# Patient Record
Sex: Female | Born: 1985 | Race: White | Hispanic: Yes | Marital: Single | Smoking: Current some day smoker
Health system: Southern US, Community
[De-identification: ages and names within clinical notes are randomized; demographics above are authoritative.]

## PROBLEM LIST (undated history)

## (undated) DIAGNOSIS — F329 Major depressive disorder, single episode, unspecified: Secondary | ICD-10-CM

## (undated) DIAGNOSIS — F32A Depression, unspecified: Secondary | ICD-10-CM

## (undated) HISTORY — PX: APPENDECTOMY: SHX54

## (undated) HISTORY — PX: HERNIA REPAIR: SHX51

## (undated) HISTORY — PX: CHOLECYSTECTOMY: SHX55

---
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
## 2005-06-04 ENCOUNTER — Emergency Department (HOSPITAL_COMMUNITY): Admission: EM | Admit: 2005-06-04 | Discharge: 2005-06-04 | Payer: Self-pay | Admitting: Emergency Medicine

## 2005-06-29 ENCOUNTER — Emergency Department (HOSPITAL_COMMUNITY): Admission: EM | Admit: 2005-06-29 | Discharge: 2005-06-30 | Payer: Self-pay | Admitting: Emergency Medicine

## 2005-07-09 ENCOUNTER — Ambulatory Visit (HOSPITAL_COMMUNITY): Admission: RE | Admit: 2005-07-09 | Discharge: 2005-07-09 | Payer: Self-pay | Admitting: Gastroenterology

## 2005-07-30 ENCOUNTER — Ambulatory Visit (HOSPITAL_COMMUNITY): Admission: RE | Admit: 2005-07-30 | Discharge: 2005-07-31 | Payer: Self-pay | Admitting: General Surgery

## 2005-07-30 ENCOUNTER — Encounter (INDEPENDENT_AMBULATORY_CARE_PROVIDER_SITE_OTHER): Payer: Self-pay | Admitting: *Deleted

## 2006-12-21 ENCOUNTER — Emergency Department (HOSPITAL_COMMUNITY): Admission: EM | Admit: 2006-12-21 | Discharge: 2006-12-21 | Payer: Self-pay | Admitting: Emergency Medicine

## 2007-03-26 ENCOUNTER — Emergency Department (HOSPITAL_COMMUNITY): Admission: EM | Admit: 2007-03-26 | Discharge: 2007-03-26 | Payer: Self-pay | Admitting: Emergency Medicine

## 2007-09-12 ENCOUNTER — Emergency Department (HOSPITAL_COMMUNITY): Admission: EM | Admit: 2007-09-12 | Discharge: 2007-09-12 | Payer: Self-pay | Admitting: Emergency Medicine

## 2010-06-02 NOTE — Op Note (Signed)
Brooke Morgan             ACCOUNT NO.:  [id number]   MEDICAL RECORD NO.:  [id number]          PATIENT TYPE:  AMB   LOCATION:  ENDO                         FACILITY:  [facility]   PHYSICIAN:  Shirley Friar, MDDATE OF BIRTH:  1985-11-07   DATE OF PROCEDURE:  07/09/2005  DATE OF DISCHARGE:                                 OPERATIVE REPORT   REFERRING PHYSICIAN:  None.   INDICATION:  Abdominal pain, vomiting.   HISTORY:  A 25 year old Hispanic female who has had severe epigastric pain  radiating to her back since 2006 that is worse with eating.  She had  evidence of gallstones on ultrasound of October2006 but at the time could  not afford gallbladder surgery and waited until this point.  The abdominal  pain has now worsened and is unrelieved by Tums, Pepcid, Prilosec or other  acid-suppression medicines.  She is undergoing this upper endoscopy to rule  out peptic ulcer disease prior to referral to [facility] for  gallbladder removal.   MEDICATIONS:  Fentanyl 50 mcg IV, Versed 8 mg IV.   FINDINGS:  Endoscope was inserted into the esophagus which was normal in its  entirety.  It was advanced down into the stomach which revealed a small  hiatal hernia, but otherwise was normal without any ulcers or mucosal  abnormalities.  The endoscope was advanced down into the duodenal bulb into  the second portion of duodenum which were both normal.  Endoscope was then  withdrawn back into the stomach, and retroflexion again revealed a small  hiatal hernia but otherwise normal proximal stomach.  Endoscope was  withdrawn and confirmed above findings.   ASSESSMENT:  Hiatal hernia, otherwise negative esophagogastroduodenoscopy.   PLAN:  1.  Refer to [facility] for symptomatic gallstones.  2.  Low-fat, small portion meals.      Shirley Friar, MD  Electronically Signed     VCS/MEDQ  D:  07/09/2005  T:  07/09/2005  Job:  [id number]   cc:   Wilmon Arms.  Corliss Skains, M.D.  [address] [place] [address] [id number]  [place]

## 2010-06-02 NOTE — Op Note (Signed)
NAMECHYREL, TAHA             ACCOUNT NO.:  [id number]   MEDICAL RECORD NO.:  [id number]          PATIENT TYPE:  OIB   LOCATION:  5714                         FACILITY:  [facility]   PHYSICIAN:  Cherylynn Ridges, M.D.    DATE OF BIRTH:  Oct 19, 1985   DATE OF PROCEDURE:  07/30/2005  DATE OF DISCHARGE:                                 OPERATIVE REPORT   PREOP DIAGNOSIS:  Symptomatic cholelithiasis.   POSTOP DIAGNOSIS:  Symptomatic cholelithiasis.   PROCEDURE:  Laparoscopic cholecystectomy with cholangiogram.   SURGEON:  Cherylynn Ridges, M.D.   ASSISTANT:  Gabrielle Dare. Janee Morn, M.D. Dirk Dress as the Engineer, building services.   ANESTHESIA:  General endotracheal estimated blood loss less than 30 mL.   COMPLICATIONS:  None.   CONDITION:  Stable.   INDICATIONS FOR OPERATION:  The patient is a 25 year old with abdominal pain  in the right upper quadrant epigastrium with gallstones noted on ultrasound  who comes in now for a laparoscopic cholecystectomy.   FINDINGS:  The patient had adhesions to the gallbladder indicative of  previous inflammation and chronic cholecystitis.  No evidence of acute  cholecystitis.   OPERATION:  The patient was taken to the operating room, placed on table in  the supine position.  After an adequate endotracheal anesthetic was  administered she was prepped and draped in the usual sterile manner exposing  the midline in the right upper quadrant.   A supraumbilical curvilinear incision was made using a #11 blade and taken  down to the midline fascia.  It was at the level of the fascia that it was  grasped with Kocher clamps; was incised in between the Kocher clamps with a  #15 blade.  When this was down to the preperitoneal space which was then  bluntly pierced using a Kelly clamp into the peritoneal cavity.  A  pursestring suture of #0 Vicryl was passed around the fascial incision into  the peritoneal cavity; and then a Hassan cannula passed through  the  peritoneal opening into the peritoneal cavity and secured in place with a  pursestring suture.   Carbon dioxide gas was then instilled through the [facility] cannula into the  peritoneal cavity up to a maximum intra-abdominal pressure of 15 mmHg.  Then  two right costal margin 5-mm cannulas and a subxiphoid 11/12 mm cannula  passed under direct vision into the peritoneal cavity.  Once this was done,  the patient was placed in reverse Trendelenburg.  The Left-side was tilted  down and the dissection begun.   There were some adhesions to the gallbladder body of the omentum and part of  the duodenum which were taken down bluntly.  This exposed the infundibulum  as we retracted the gallbladder, towards the anterior abdominal wall and the  right upper quadrant.   We were able expose the peritoneum overlying the triangle of Calot and the  hepatic duodenal triangle; and then dissect out the cystic duct and the  cystic artery.  Along with that was the lymph node of Calot which was  dissected and taken along with the gallbladder side.  We made a window  around the cystic duct and then passed the endoclip along the gallbladder  side, made a cholecystodochotomy using the laparoscopic scissors; and then  performed a cholangiogram through the cholecystodochotomy using a Cook  catheter.  This showed good flow into the duodenum, no intraductal filling  defects, no dilated duct, and good proximal filling.  The [facility] catheter was  removed and endoclipped, which was then placed, securing the catheter in  place and was removed.  Then we endoclipped the cystic duct distally x2 and  transected the cystic duct.  We placed two proximal and two distal cystic  artery clips and then transected the cystic artery and then dissect out the  gallbladder from its bed with minimal difficulty.  We then brought out the  gallbladder from the supraumbilical site with minimal difficulty.  All  counts were correct.  No  EndoCatch bag was necessary.  We irrigated with  less than 1 liter of saline solution in the gallbladder fossa and right  upper quadrant; and then subsequently aspirated all gas and fluid from above  the liver and removed all cannulas.   The supraumbilical site was closed using a pursestring suture which was then  placed.  The skin was closed using a running subcuticular stitch of 4-0  Vicryl.  Then 1/4% Marcaine was injected in all sites prior to closing the  skin.  The sterile dressings were applied.  All needle count, sponge counts  and instrument counts were correct.      Cherylynn Ridges, M.D.  Electronically Signed     JOW/MEDQ  D:  07/30/2005  T:  07/30/2005  Job:  [id number]

## 2010-10-09 LAB — WET PREP, GENITAL: Trich, Wet Prep: NONE SEEN

## 2010-10-09 LAB — POCT PREGNANCY, URINE
Operator id: 146091
Preg Test, Ur: NEGATIVE

## 2010-10-09 LAB — URINALYSIS, ROUTINE W REFLEX MICROSCOPIC
Bilirubin Urine: NEGATIVE
Glucose, UA: NEGATIVE
Hgb urine dipstick: NEGATIVE
Ketones, ur: NEGATIVE
Nitrite: NEGATIVE
Protein, ur: NEGATIVE
Specific Gravity, Urine: 1.026
Urobilinogen, UA: 1
pH: 6

## 2010-10-09 LAB — GC/CHLAMYDIA PROBE AMP, GENITAL
Chlamydia, DNA Probe: NEGATIVE
GC Probe Amp, Genital: NEGATIVE

## 2011-07-12 ENCOUNTER — Encounter (HOSPITAL_BASED_OUTPATIENT_CLINIC_OR_DEPARTMENT_OTHER): Payer: Self-pay | Admitting: Emergency Medicine

## 2011-07-12 ENCOUNTER — Emergency Department (HOSPITAL_BASED_OUTPATIENT_CLINIC_OR_DEPARTMENT_OTHER)
Admission: EM | Admit: 2011-07-12 | Discharge: 2011-07-12 | Disposition: A | Discharge status: Home / Self Care | Payer: Self-pay | Attending: Emergency Medicine | Admitting: Emergency Medicine

## 2011-07-12 ENCOUNTER — Emergency Department (HOSPITAL_BASED_OUTPATIENT_CLINIC_OR_DEPARTMENT_OTHER): Payer: Self-pay

## 2011-07-12 DIAGNOSIS — R112 Nausea with vomiting, unspecified: Secondary | ICD-10-CM | POA: Insufficient documentation

## 2011-07-12 DIAGNOSIS — N72 Inflammatory disease of cervix uteri: Secondary | ICD-10-CM | POA: Insufficient documentation

## 2011-07-12 DIAGNOSIS — R109 Unspecified abdominal pain: Secondary | ICD-10-CM | POA: Insufficient documentation

## 2011-07-12 LAB — BASIC METABOLIC PANEL
BUN: 11 mg/dL (ref 6–23)
CO2: 24 mEq/L (ref 19–32)
Calcium: 9.4 mg/dL (ref 8.4–10.5)
Chloride: 101 mEq/L (ref 96–112)
Creatinine, Ser: 0.7 mg/dL (ref 0.50–1.10)
GFR calc Af Amer: >90 mL/min (ref >90)
GFR calc non Af Amer: >90 mL/min (ref >90)
Potassium: 3.8 mEq/L (ref 3.5–5.1)
Sodium: 137 mEq/L (ref 135–145)

## 2011-07-12 LAB — LIPASE, BLOOD: Lipase: 27 U/L (ref 11–59)

## 2011-07-12 LAB — WET PREP, GENITAL
Clue Cells Wet Prep HPF POC: NONE SEEN
Trich, Wet Prep: NONE SEEN
Yeast Wet Prep HPF POC: NONE SEEN

## 2011-07-12 LAB — CBC WITH DIFFERENTIAL/PLATELET
Basophils Absolute: 0 10*3/uL (ref 0.0–0.1)
Eosinophils Absolute: 0.2 10*3/uL (ref 0.0–0.7)
Eosinophils Relative: 1 % (ref 0–5)
HCT: 41 % (ref 36.0–46.0)
Hemoglobin: 14.4 g/dL (ref 12.0–15.0)
Lymphocytes Relative: 29 % (ref 12–46)
Lymphs Abs: 3.5 10*3/uL (ref 0.7–4.0)
MCH: 29.8 pg (ref 26.0–34.0)
MCHC: 35.1 g/dL (ref 30.0–36.0)
MCV: 84.7 fL (ref 78.0–100.0)
Monocytes Absolute: 1.4 10*3/uL — ABNORMAL HIGH (ref 0.1–1.0)
Monocytes Relative: 12 % (ref 3–12)
RBC: 4.84 MIL/uL (ref 3.87–5.11)
RDW: 13.6 % (ref 11.5–15.5)
WBC: 12.2 10*3/uL — ABNORMAL HIGH (ref 4.0–10.5)

## 2011-07-12 LAB — PREGNANCY, URINE: Preg Test, Ur: NEGATIVE

## 2011-07-12 LAB — URINALYSIS, ROUTINE W REFLEX MICROSCOPIC
Bilirubin Urine: NEGATIVE
Glucose, UA: NEGATIVE mg/dL
Hgb urine dipstick: NEGATIVE
Protein, ur: NEGATIVE mg/dL
Specific Gravity, Urine: 1.024 (ref 1.005–1.030)
Urobilinogen, UA: 0.2 mg/dL (ref 0.0–1.0)
pH: 6 (ref 5.0–8.0)

## 2011-07-12 LAB — URINE MICROSCOPIC-ADD ON

## 2011-07-12 MED ORDER — IOHEXOL 300 MG/ML  SOLN
20.0000 mL | Freq: Once | INTRAMUSCULAR | Status: AC | PRN
  Administered 2011-07-12: 20 mL via ORAL

## 2011-07-12 MED ORDER — CEFTRIAXONE SODIUM 250 MG IJ SOLR
250.0000 mg | Freq: Once | INTRAMUSCULAR | Status: AC
  Administered 2011-07-12: 250 mg via INTRAMUSCULAR
  Filled 2011-07-12: qty 250

## 2011-07-12 MED ORDER — LIDOCAINE HCL 2 % IJ SOLN
INTRAMUSCULAR | Status: AC
  Administered 2011-07-12: 1 mg
  Filled 2011-07-12: qty 1

## 2011-07-12 MED ORDER — HYDROCODONE-ACETAMINOPHEN 5-500 MG PO TABS: 1.0000 | ORAL_TABLET | Freq: Four times a day (QID) | ORAL | Status: AC | PRN

## 2011-07-12 MED ORDER — IOHEXOL 300 MG/ML  SOLN
100.0000 mL | Freq: Once | INTRAMUSCULAR | Status: AC | PRN
  Administered 2011-07-12: 100 mL via INTRAVENOUS

## 2011-07-12 MED ORDER — FENTANYL CITRATE 0.05 MG/ML IJ SOLN
50.0000 ug | Freq: Once | INTRAMUSCULAR | Status: AC
  Administered 2011-07-12: 50 ug via INTRAVENOUS
  Filled 2011-07-12: qty 2

## 2011-07-12 MED ORDER — AZITHROMYCIN 1 G PO PACK
1.0000 g | PACK | Freq: Once | ORAL | Status: AC
  Administered 2011-07-12: 1 g via ORAL
  Filled 2011-07-12: qty 1

## 2011-07-12 NOTE — ED Provider Notes (Signed)
History     CSN: [id number]  Arrival date & time 07/12/11  0110   First MD Initiated Contact with Patient 07/12/11 0134      Chief Complaint  Patient presents with  . Flank Pain    (Consider location/radiation/quality/duration/timing/severity/associated sxs/prior treatment) Patient is a 26 y.o. female presenting with abdominal pain. The history is provided by the patient. No language interpreter was used.  Abdominal Pain The primary symptoms of the illness include abdominal pain, nausea and vomiting. The primary symptoms of the illness do not include fever, fatigue, dysuria or vaginal discharge. The current episode started more than 2 days ago. The onset of the illness was gradual. The problem has been gradually worsening.  The abdominal pain began more than 2 days ago. The pain came on gradually. The abdominal pain has been gradually worsening since its onset. The abdominal pain is located in the LUQ and LLQ. The abdominal pain does not radiate. The severity of the abdominal pain is 8/10. The abdominal pain is relieved by nothing. Exacerbated by: nothing.  The vomiting began today. Vomiting occurred once. The emesis contains stomach contents.  Associated with: nothing. The patient states that she believes she is currently not pregnant. The patient has not had a change in bowel habit. Risk factors for an acute abdominal problem include a history of abdominal surgery. Symptoms associated with the illness do not include constipation or back pain. Significant associated medical issues do not include liver disease.    History reviewed. No pertinent past medical history.  Past Surgical History  Procedure Date  . Cholecystectomy   . Hernia repair     History reviewed. No pertinent family history.  History  Substance Use Topics  . Smoking status: Current Some Day Smoker  . Smokeless tobacco: Not on file  . Alcohol Use: Yes     ocassionally    OB History    Grav Para Term Preterm  Abortions TAB SAB Ect Mult Living                  Review of Systems  Constitutional: Negative for fever and fatigue.  Gastrointestinal: Positive for nausea, vomiting and abdominal pain. Negative for constipation.  Genitourinary: Negative for dysuria and vaginal discharge.  Musculoskeletal: Negative for back pain.  All other systems reviewed and are negative.    Allergies  Review of patient's allergies indicates no known allergies.  Home Medications  No current outpatient prescriptions on file.  BP 125/88  Pulse 84  Temp 98.1 F (36.7 C) (Oral)  Resp 20  Ht 5\' 2"  (1.575 m)  Wt 120 lb (54.432 kg)  BMI 21.95 kg/m2  SpO2 98%  LMP 06/16/2011  Physical Exam  Constitutional: She is oriented to person, place, and time. She appears well-developed and well-nourished. No distress.  HENT:  Head: Normocephalic and atraumatic.  Mouth/Throat: Oropharynx is clear and moist.  Eyes: Conjunctivae are normal. Pupils are equal, round, and reactive to light.  Neck: Normal range of motion. Neck supple.  Cardiovascular: Normal rate and regular rhythm.   Pulmonary/Chest: Effort normal and breath sounds normal.  Abdominal: Bowel sounds are normal. She exhibits no distension. There is tenderness. There is guarding. There is no rebound.  Genitourinary: Vaginal discharge found.       CMT, chaperone present  Musculoskeletal: Normal range of motion. She exhibits no tenderness.  Neurological: She is alert and oriented to person, place, and time.  Skin: Skin is warm and dry.  Psychiatric: She has a normal mood  and affect.    ED Course  Procedures (including critical care time)  Labs Reviewed  CBC WITH DIFFERENTIAL - Abnormal; Notable for the following:    WBC 12.2 (*)     Monocytes Absolute 1.4 (*)     All other components within normal limits  URINALYSIS, ROUTINE W REFLEX MICROSCOPIC - Abnormal; Notable for the following:    APPearance CLOUDY (*)     Leukocytes, UA TRACE (*)     All  other components within normal limits  WET PREP, GENITAL - Abnormal; Notable for the following:    WBC, Wet Prep HPF POC FEW (*)     All other components within normal limits  URINE MICROSCOPIC-ADD ON - Abnormal; Notable for the following:    Squamous Epithelial / LPF MANY (*)     Bacteria, UA MANY (*)     All other components within normal limits  LIPASE, BLOOD  PREGNANCY, URINE  BASIC METABOLIC PANEL  GC/CHLAMYDIA PROBE AMP, GENITAL   No results found.   No diagnosis found.    MDM  Will treat for STI.  Patient informed no sexual contact of any kind until 7 days after all partners treated.  Recheck in 7 days.  Return to the closest ED for worsening symptoms.  Patient informed of appendicolith, if future RLQ return to the closest ED.  Patient verbalizes understanding and agrees to follow up        Naryiah Schley Smitty Cords, MD 07/12/11 [phone]

## 2011-07-12 NOTE — ED Notes (Signed)
Left sided flank pain that radiates to left lower abdomen

## 2011-07-12 NOTE — Discharge Instructions (Signed)
Cervicitis   Cervicitis is a soreness and swelling (inflammation) of the cervix. Your cervix is located at the bottom of your uterus which opens up to the vagina.   CAUSES   Sexually transmitted infections (STIs).   Allergic reaction.   Medicines or birth control devices that are put in the vagina.   Injury to the cervix.   Bacterial infections.   SYMPTOMS   There may be no symptoms. If symptoms occur, they may include:   Grey, white, yellow, or bad smelling vaginal discharge.   Pain or itching of the area outside the vagina.   Painful sexual intercourse.   Lower abdominal or lower back pain, especially during intercourse.   Frequent urination.   Abnormal vaginal bleeding between periods, after sexual intercourse, or after menopause.   Pressure or a heavy feeling in the pelvis.   DIAGNOSIS   Diagnosis is made after a pelvic exam. Other tests may include:   Examination of any discharge under a microscope (wet prep).   A Pap test.   TREATMENT   Treatment will depend on the cause of cervicitis. If it is caused by an STI, both you and your partner will need to be treated. Antibiotic medicines will be given.   HOME CARE INSTRUCTIONS   Do not have sexual intercourse until your caregiver says it is okay.   Do not have sexual intercourse until your partner has been treated if your cervicitis is caused by an STI.   Take your antibiotics as directed. Finish them even if you start to feel better.   SEEK IMMEDIATE MEDICAL CARE IF:   Your symptoms come back.   You have a fever.   You experience any problems that may be related to the medicine you are taking.   MAKE SURE YOU:   Understand these instructions.   Will watch your condition.   Will get help right away if you are not doing well or get worse.   Document Released: 01/01/2005 Document Revised: 12/21/2010 Document Reviewed: 07/31/2010   [employer] Patient Information 2012 [employer].

## 2011-07-13 LAB — GC/CHLAMYDIA PROBE AMP, GENITAL
Chlamydia, DNA Probe: NEGATIVE
GC Probe Amp, Genital: NEGATIVE

## 2011-08-24 ENCOUNTER — Emergency Department (HOSPITAL_BASED_OUTPATIENT_CLINIC_OR_DEPARTMENT_OTHER)
Admission: EM | Admit: 2011-08-24 | Discharge: 2011-08-24 | Disposition: A | Discharge status: Home / Self Care | Payer: Self-pay | Attending: Emergency Medicine | Admitting: Emergency Medicine

## 2011-08-24 ENCOUNTER — Encounter (HOSPITAL_BASED_OUTPATIENT_CLINIC_OR_DEPARTMENT_OTHER): Payer: Self-pay | Admitting: *Deleted

## 2011-08-24 ENCOUNTER — Emergency Department (HOSPITAL_BASED_OUTPATIENT_CLINIC_OR_DEPARTMENT_OTHER): Payer: Self-pay

## 2011-08-24 DIAGNOSIS — Z87891 Personal history of nicotine dependence: Secondary | ICD-10-CM | POA: Insufficient documentation

## 2011-08-24 DIAGNOSIS — J40 Bronchitis, not specified as acute or chronic: Secondary | ICD-10-CM

## 2011-08-24 DIAGNOSIS — R05 Cough: Secondary | ICD-10-CM | POA: Insufficient documentation

## 2011-08-24 DIAGNOSIS — R059 Cough, unspecified: Secondary | ICD-10-CM | POA: Insufficient documentation

## 2011-08-24 DIAGNOSIS — J3489 Other specified disorders of nose and nasal sinuses: Secondary | ICD-10-CM | POA: Insufficient documentation

## 2011-08-24 MED ORDER — HYDROCOD POLST-CHLORPHEN POLST 10-8 MG/5ML PO LQCR: ORAL | Status: DC

## 2011-08-24 MED ORDER — AZITHROMYCIN 250 MG PO TABS: 250.0000 mg | ORAL_TABLET | Freq: Every day | ORAL | Status: AC

## 2011-08-24 MED ORDER — ALBUTEROL SULFATE HFA 108 (90 BASE) MCG/ACT IN AERS
2.0000 | INHALATION_SPRAY | RESPIRATORY_TRACT | Status: DC | PRN
  Filled 2011-08-24: qty 6.7

## 2011-08-24 NOTE — ED Provider Notes (Signed)
History     CSN: [id number]  Arrival date & time 08/24/11  1939   First MD Initiated Contact with Patient 08/24/11 2016      Chief Complaint  Patient presents with  . URI    (Consider location/radiation/quality/duration/timing/severity/associated sxs/prior treatment) HPI This is a 26 year old female with a three-week history of upper respiratory infection symptoms. This began with nasal congestion, cough, fever and malaise. Most of her symptoms, including fever have resolved but she has a lingering cough and shortness of breath; the shortness of breath is worse at night in bed. She's been taking over-the-counter cold medications without relief. The symptoms are moderate. She did have an episode of lightheadedness earlier today.  History reviewed. No pertinent past medical history.  Past Surgical History  Procedure Date  . Cholecystectomy   . Hernia repair     History reviewed. No pertinent family history.  History  Substance Use Topics  . Smoking status: Former Games developer  . Smokeless tobacco: Not on file  . Alcohol Use: Yes     ocassionally    OB History    Grav Para Term Preterm Abortions TAB SAB Ect Mult Living                  Review of Systems  All other systems reviewed and are negative.    Allergies  Review of patient's allergies indicates no known allergies.  Home Medications  No current outpatient prescriptions on file.  BP 117/67  Pulse 87  Temp 98.3 F (36.8 C) (Oral)  Resp 16  Ht 5' (1.524 m)  Wt 120 lb (54.432 kg)  BMI 23.44 kg/m2  SpO2 97%  LMP 08/23/2011  Physical Exam General: Well-developed, well-nourished female in no acute distress; appearance consistent with age of record HENT: normocephalic, atraumatic; no nasal congestion; no pharyngeal erythema or exudate Eyes: pupils equal round and reactive to light; extraocular muscles intact Neck: supple Heart: regular rate and rhythm Lungs: Faint wheezing right base; frequent dry  cough Abdomen: soft; nondistended; nontender; bowel sounds present Extremities: No deformity; full range of motion Neurologic: Awake, alert and oriented; motor function intact in all extremities and symmetric; no facial droop Skin: Warm and dry Psychiatric: Normal mood and affect    ED Course  Procedures (including critical care time)     MDM   Nursing notes and vitals signs, including pulse oximetry, reviewed.  Summary of this visit's results, reviewed by myself:   Imaging Studies: Dg Chest 2 View  08/24/2011  *RADIOLOGY REPORT*  Clinical Data: Cough  CHEST - 2 VIEW  Comparison: 07/30/2005  Findings: Lungs are essentially clear.  Calcified granulomata in the bilateral perihilar regions.  No pleural effusion or pneumothorax.  Cardiomediastinal silhouette is within normal limits.  Cholecystectomy clips.  Visualized osseous structures are within normal limits.  IMPRESSION: No evidence of acute cardiopulmonary disease.  Original Report Authenticated By: Charline Bills, M.D.            Hanley Seamen, MD 08/24/11 2025

## 2011-08-24 NOTE — Patient Instructions (Signed)
Instructed pt on the proper use of adminstering albuteral mdi via aerochamber pt tolerated well

## 2011-08-24 NOTE — ED Notes (Signed)
Pt c/o URI symptoms x 3 weeks 

## 2011-09-29 ENCOUNTER — Encounter (HOSPITAL_COMMUNITY): Payer: Self-pay

## 2011-09-29 ENCOUNTER — Emergency Department (HOSPITAL_COMMUNITY): Payer: Self-pay

## 2011-09-29 ENCOUNTER — Emergency Department (HOSPITAL_COMMUNITY)
Admission: EM | Admit: 2011-09-29 | Discharge: 2011-09-29 | Disposition: A | Discharge status: Home / Self Care | Payer: Self-pay | Attending: Emergency Medicine | Admitting: Emergency Medicine

## 2011-09-29 DIAGNOSIS — Z87891 Personal history of nicotine dependence: Secondary | ICD-10-CM | POA: Insufficient documentation

## 2011-09-29 DIAGNOSIS — K921 Melena: Secondary | ICD-10-CM | POA: Insufficient documentation

## 2011-09-29 DIAGNOSIS — N898 Other specified noninflammatory disorders of vagina: Secondary | ICD-10-CM | POA: Insufficient documentation

## 2011-09-29 DIAGNOSIS — Z9089 Acquired absence of other organs: Secondary | ICD-10-CM | POA: Insufficient documentation

## 2011-09-29 DIAGNOSIS — R109 Unspecified abdominal pain: Secondary | ICD-10-CM

## 2011-09-29 DIAGNOSIS — R1032 Left lower quadrant pain: Secondary | ICD-10-CM | POA: Insufficient documentation

## 2011-09-29 LAB — COMPREHENSIVE METABOLIC PANEL
AST: 22 U/L (ref 0–37)
Albumin: 4.2 g/dL (ref 3.5–5.2)
Alkaline Phosphatase: 65 U/L (ref 39–117)
Chloride: 103 mEq/L (ref 96–112)
Potassium: 4 mEq/L (ref 3.5–5.1)
Total Bilirubin: 0.5 mg/dL (ref 0.3–1.2)

## 2011-09-29 LAB — URINALYSIS, ROUTINE W REFLEX MICROSCOPIC
Glucose, UA: NEGATIVE mg/dL
Hgb urine dipstick: NEGATIVE
Ketones, ur: NEGATIVE mg/dL
Protein, ur: NEGATIVE mg/dL

## 2011-09-29 LAB — CBC WITH DIFFERENTIAL/PLATELET
Basophils Absolute: 0 10*3/uL (ref 0.0–0.1)
Basophils Relative: 0 % (ref 0–1)
MCHC: 34.6 g/dL (ref 30.0–36.0)
Neutro Abs: 4.9 10*3/uL (ref 1.7–7.7)
Neutrophils Relative %: 64 % (ref 43–77)
RDW: 13.4 % (ref 11.5–15.5)

## 2011-09-29 LAB — WET PREP, GENITAL: Yeast Wet Prep HPF POC: NONE SEEN

## 2011-09-29 LAB — POCT PREGNANCY, URINE: Preg Test, Ur: NEGATIVE

## 2011-09-29 MED ORDER — MORPHINE SULFATE 4 MG/ML IJ SOLN
4.0000 mg | Freq: Once | INTRAMUSCULAR | Status: AC
  Administered 2011-09-29: 4 mg via INTRAVENOUS
  Filled 2011-09-29: qty 1

## 2011-09-29 MED ORDER — IOHEXOL 300 MG/ML  SOLN
80.0000 mL | Freq: Once | INTRAMUSCULAR | Status: AC | PRN
  Administered 2011-09-29: 80 mL via INTRAVENOUS

## 2011-09-29 MED ORDER — ONDANSETRON HCL 4 MG/2ML IJ SOLN
4.0000 mg | Freq: Once | INTRAMUSCULAR | Status: AC
  Administered 2011-09-29: 4 mg via INTRAVENOUS
  Filled 2011-09-29: qty 2

## 2011-09-29 MED ORDER — POLYETHYLENE GLYCOL 3350 17 G PO PACK: 17.0000 g | PACK | Freq: Every day | ORAL | Status: AC

## 2011-09-29 MED ORDER — IOHEXOL 300 MG/ML  SOLN
20.0000 mL | INTRAMUSCULAR | Status: AC
  Administered 2011-09-29: 20 mL via ORAL

## 2011-09-29 NOTE — ED Provider Notes (Signed)
History     CSN: [id number]  Arrival date & time 09/29/11  1217   First MD Initiated Contact with Patient 09/29/11 1245      Chief Complaint  Patient presents with  . Abdominal Pain    (Consider location/radiation/quality/duration/timing/severity/associated sxs/prior treatment) Patient is a 26 y.o. female presenting with abdominal pain. The history is provided by the patient.  Abdominal Pain The primary symptoms of the illness include abdominal pain, hematochezia and vaginal discharge. The primary symptoms of the illness do not include fever, fatigue, shortness of breath, nausea, vomiting, diarrhea, hematemesis, dysuria or vaginal bleeding. The current episode started more than 2 days ago. The onset of the illness was sudden. The problem has been gradually worsening.  The pain came on suddenly. The abdominal pain is located in the LLQ, LUQ and left flank. The severity of the abdominal pain is 8/10.  The vaginal discharge is not associated with dysuria.  Symptoms associated with the illness do not include chills, urgency, hematuria or frequency.  Pt states pain comes and goes. Nothing makes it better or worse. States brown vaginal discharge. Has also seen blood when she wipes after a bowel movemetn. NO fever, chills, urinary symptoms, no n/v/d.  No past medical history on file.  Past Surgical History  Procedure Date  . Cholecystectomy   . Hernia repair     No family history on file.  History  Substance Use Topics  . Smoking status: Former Games developer  . Smokeless tobacco: Not on file  . Alcohol Use: Yes     ocassionally    OB History    Grav Para Term Preterm Abortions TAB SAB Ect Mult Living                  Review of Systems  Constitutional: Negative for fever, chills and fatigue.  HENT: Negative for neck pain and neck stiffness.   Respiratory: Negative for chest tightness and shortness of breath.   Cardiovascular: Negative for chest pain and leg swelling.    Gastrointestinal: Positive for abdominal pain, blood in stool and hematochezia. Negative for nausea, vomiting, diarrhea and hematemesis.  Genitourinary: Positive for flank pain and vaginal discharge. Negative for dysuria, urgency, frequency, hematuria and vaginal bleeding.  Musculoskeletal: Negative for myalgias.  Skin: Negative.   Neurological: Negative for dizziness, weakness and headaches.  Hematological: Does not bruise/bleed easily.    Allergies  Review of patient's allergies indicates no known allergies.  Home Medications  No current outpatient prescriptions on file.  BP 112/58  Pulse 65  Temp 97.7 F (36.5 C) (Oral)  Resp 16  SpO2 100%  Physical Exam  Nursing note and vitals reviewed. Constitutional: She is oriented to person, place, and time. She appears well-developed and well-nourished.  HENT:  Head: Normocephalic.  Eyes: Conjunctivae normal are normal.  Neck: Neck supple.  Cardiovascular: Normal rate, regular rhythm and normal heart sounds.   Pulmonary/Chest: Effort normal and breath sounds normal. No respiratory distress. She has no wheezes. She has no rales.  Abdominal: Soft. Bowel sounds are normal.       LUQ, LLQ tenderness. No CVA tenderness  Genitourinary: Vagina normal.       Normal vaginal canal, Brownish vaginal discharge. Normal cervix. No CMT. Left adnexal tenderness, uterus normal.   Musculoskeletal: Normal range of motion. She exhibits no edema.  Neurological: She is alert and oriented to person, place, and time.  Skin: Skin is warm and dry.  Psychiatric: She has a normal mood and affect.  ED Course  Procedures (including critical care time)  Pt with left sided abdominal pain. Hx of the same. Had negative CT 3 mon ago for similar pain.Will get labs, pain meds ordered, pelvic exam, urine  Results for orders placed during the hospital encounter of 09/29/11  CBC WITH DIFFERENTIAL      Component Value Range   WBC 7.7  4.0 - 10.5 K/uL   RBC 4.87   3.87 - 5.11 MIL/uL   Hemoglobin 14.4  12.0 - 15.0 g/dL   HCT 16.1  09.6 - 04.5 %   MCV 85.4  78.0 - 100.0 fL   MCH 29.6  26.0 - 34.0 pg   MCHC 34.6  30.0 - 36.0 g/dL   RDW 40.9  81.1 - 91.4 %   Platelets 304  150 - 400 K/uL   Neutrophils Relative 64  43 - 77 %   Neutro Abs 4.9  1.7 - 7.7 K/uL   Lymphocytes Relative 25  12 - 46 %   Lymphs Abs 1.9  0.7 - 4.0 K/uL   Monocytes Relative 9  3 - 12 %   Monocytes Absolute 0.7  0.1 - 1.0 K/uL   Eosinophils Relative 1  0 - 5 %   Eosinophils Absolute 0.1  0.0 - 0.7 K/uL   Basophils Relative 0  0 - 1 %   Basophils Absolute 0.0  0.0 - 0.1 K/uL  COMPREHENSIVE METABOLIC PANEL      Component Value Range   Sodium 139  135 - 145 mEq/L   Potassium 4.0  3.5 - 5.1 mEq/L   Chloride 103  96 - 112 mEq/L   CO2 28  19 - 32 mEq/L   Glucose, Bld 80  70 - 99 mg/dL   BUN 14  6 - 23 mg/dL   Creatinine, Ser 7.82  0.50 - 1.10 mg/dL   Calcium 9.9  8.4 - 95.6 mg/dL   Total Protein 7.7  6.0 - 8.3 g/dL   Albumin 4.2  3.5 - 5.2 g/dL   AST 22  0 - 37 U/L   ALT 12  0 - 35 U/L   Alkaline Phosphatase 65  39 - 117 U/L   Total Bilirubin 0.5  0.3 - 1.2 mg/dL   GFR calc non Af Amer >90  >90 mL/min   GFR calc Af Amer >90  >90 mL/min  URINALYSIS, ROUTINE W REFLEX MICROSCOPIC      Component Value Range   Color, Urine YELLOW  YELLOW   APPearance CLEAR  CLEAR   Specific Gravity, Urine 1.028  1.005 - 1.030   pH 6.0  5.0 - 8.0   Glucose, UA NEGATIVE  NEGATIVE mg/dL   Hgb urine dipstick NEGATIVE  NEGATIVE   Bilirubin Urine NEGATIVE  NEGATIVE   Ketones, ur NEGATIVE  NEGATIVE mg/dL   Protein, ur NEGATIVE  NEGATIVE mg/dL   Urobilinogen, UA 0.2  0.0 - 1.0 mg/dL   Nitrite NEGATIVE  NEGATIVE   Leukocytes, UA NEGATIVE  NEGATIVE  POCT PREGNANCY, URINE      Component Value Range   Preg Test, Ur NEGATIVE  NEGATIVE  LIPASE, BLOOD      Component Value Range   Lipase 23  11 - 59 U/L  WET PREP, GENITAL      Component Value Range   Yeast Wet Prep HPF POC NONE SEEN  NONE SEEN    Trich, Wet Prep NONE SEEN  NONE SEEN   Clue Cells Wet Prep HPF POC FEW (*) NONE SEEN  WBC, Wet Prep HPF POC MODERATE (*) NONE SEEN   US Transvaginal Non-ob  09/29/2011  *RADIOLOGY REPORT*  Clinical Data:  Pelvic pain, query ovarian torsion or tubo-ovarian abscess.  TRANSABDOMINAL AND TRANSVAGINAL ULTRASOUND OF PELVIS DOPPLER ULTRASOUND OF OVARIES  Technique:  Both transabdominal and transvaginal ultrasound examinations of the pelvis were performed. Transabdominal technique was performed for global imaging of the pelvis including uterus, ovaries, adnexal regions, and pelvic cul-de-sac.  It was necessary to proceed with endovaginal exam following the transabdominal exam to visualize the uterus, endometrium, and ovaries.  Color and duplex Doppler ultrasound was utilized to evaluate blood flow to the ovaries.  Comparison:  07/12/2011  Findings:  Uterus:  On image 43, a 7 mm punctate hyper echogenicity in the anterior myometrium may reflect an incidental punctate calcification.  No overt shadowing.  No myometrial mass observed.  Endometrium:  Measures 1 mm in thickness, within normal limits.  Right ovary: Measures 2.3 x 1.5 x 1.9 cm and contains multiple follicles.  No mass lesion.  Left ovary:    Measures 2.1 x 1.3 x 1.6 cm and contains multiple follicles.  No mass lesion.  Pulsed Doppler evaluation demonstrates normal low-resistance arterial and venous waveforms in both ovaries.  Other:  No free pelvic fluid observed.  IMPRESSION: Normal exam.  No evidence of pelvic mass, tubo-ovarian abscess, or other significant abnormality.  No sonographic evidence for ovarian torsion.   Original Report Authenticated By: Dellia Cloud, M.D.    US Pelvis Complete  09/29/2011  *RADIOLOGY REPORT*  Clinical Data:  Pelvic pain, query ovarian torsion or tubo-ovarian abscess.  TRANSABDOMINAL AND TRANSVAGINAL ULTRASOUND OF PELVIS DOPPLER ULTRASOUND OF OVARIES  Technique:  Both transabdominal and transvaginal ultrasound  examinations of the pelvis were performed. Transabdominal technique was performed for global imaging of the pelvis including uterus, ovaries, adnexal regions, and pelvic cul-de-sac.  It was necessary to proceed with endovaginal exam following the transabdominal exam to visualize the uterus, endometrium, and ovaries.  Color and duplex Doppler ultrasound was utilized to evaluate blood flow to the ovaries.  Comparison:  07/12/2011  Findings:  Uterus:  On image 43, a 7 mm punctate hyper echogenicity in the anterior myometrium may reflect an incidental punctate calcification.  No overt shadowing.  No myometrial mass observed.  Endometrium:  Measures 1 mm in thickness, within normal limits.  Right ovary: Measures 2.3 x 1.5 x 1.9 cm and contains multiple follicles.  No mass lesion.  Left ovary:    Measures 2.1 x 1.3 x 1.6 cm and contains multiple follicles.  No mass lesion.  Pulsed Doppler evaluation demonstrates normal low-resistance arterial and venous waveforms in both ovaries.  Other:  No free pelvic fluid observed.  IMPRESSION: Normal exam.  No evidence of pelvic mass, tubo-ovarian abscess, or other significant abnormality.  No sonographic evidence for ovarian torsion.   Original Report Authenticated By: Dellia Cloud, M.D.    [place] Art/ven Flow Abd Pelv Doppler  09/29/2011  *RADIOLOGY REPORT*  Clinical Data:  Pelvic pain, query ovarian torsion or tubo-ovarian abscess.  TRANSABDOMINAL AND TRANSVAGINAL ULTRASOUND OF PELVIS DOPPLER ULTRASOUND OF OVARIES  Technique:  Both transabdominal and transvaginal ultrasound examinations of the pelvis were performed. Transabdominal technique was performed for global imaging of the pelvis including uterus, ovaries, adnexal regions, and pelvic cul-de-sac.  It was necessary to proceed with endovaginal exam following the transabdominal exam to visualize the uterus, endometrium, and ovaries.  Color and duplex Doppler ultrasound was utilized to evaluate blood flow to the ovaries.  Comparison:  07/12/2011  Findings:  Uterus:  On image 43, a 7 mm punctate hyper echogenicity in the anterior myometrium may reflect an incidental punctate calcification.  No overt shadowing.  No myometrial mass observed.  Endometrium:  Measures 1 mm in thickness, within normal limits.  Right ovary: Measures 2.3 x 1.5 x 1.9 cm and contains multiple follicles.  No mass lesion.  Left ovary:    Measures 2.1 x 1.3 x 1.6 cm and contains multiple follicles.  No mass lesion.  Pulsed Doppler evaluation demonstrates normal low-resistance arterial and venous waveforms in both ovaries.  Other:  No free pelvic fluid observed.  IMPRESSION: Normal exam.  No evidence of pelvic mass, tubo-ovarian abscess, or other significant abnormality.  No sonographic evidence for ovarian torsion.   Original Report Authenticated By: Dellia Cloud, M.D.     4:24 PM [place] normal. Pt's blood work is normal. Will hold off on another CT, doubt infectious process, no elevated WBC, normal electrolyte, will d/c home with follow up for further evaluation. Did not treat for possible cervicitis bc pt refused tx, states not sexually active.   Pt reassessed. She continues to have a lot of pain. She is tearful. She is now having some guarding on the abdominal exam in the left lower quadrant. Will get CT to r/o colitis/surgical pathology. Pt signed out with PA oliveri at shift change  1. Abdominal pain       MDM          Lottie Mussel, PA 09/30/11 1116

## 2011-09-29 NOTE — ED Notes (Signed)
Patient to ED with C/O abdominal pain that began 3 days ago. Pain is across her lower abdomen and is in her LUQ.  Pain is sharp in her LUQ and dull across her lower abdomen.  LUQ is very tender to palpation. C/O some blood in stool when she wipes and C/O brown vaginal discharge.  Denies Nausea , vomiting, diarrhea and loss of appetite.

## 2011-09-29 NOTE — ED Notes (Signed)
Pt complains of abd pain in low abd and left flank area, sts getting wosre with time

## 2011-09-29 NOTE — ED Provider Notes (Signed)
Patient care assumed from T.K. Patient given additional pain medication in ED with improvement. Imaging negative for acute abdomen but remarkable for large amount of fecal matter. Patient discharged on bowel regimen, and referred to OB/GYN as she has never had a pap smear and has had some brownish discharge that she cannot attribute the cause. No red flags for PID or appendicitis. Return precautions given verbally and in discharge summary.   CT Abdomen Pelvis W Contrast (Final result)   Result time:09/29/11 2010    Final result by Rad Results In Interface (09/29/11 20:10:09)    Narrative:   *RADIOLOGY REPORT*  Clinical Data: Days of lower abdominal pain, most severe in the left lower quadrant. The  CT ABDOMEN AND PELVIS WITH CONTRAST  Technique: Multidetector CT imaging of the abdomen and pelvis was performed following the standard protocol during bolus administration of intravenous contrast.  Contrast: 80mL OMNIPAQUE IOHEXOL 300 MG/ML SOLN  Comparison: 07/12/2011. Ultrasound today.  Findings: Lung bases are clear. No pleural or pericardial fluid. The liver has a normal appearance. There is been previous cholecystectomy. The spleen is normal. The pancreas is normal. The adrenal glands are normal. The kidneys are normal. No cyst, mass, stone or hydronephrosis. The aorta and IVC are normal. No retroperitoneal mass or adenopathy. No free intraperitoneal fluid or air. The appendix does not show any signs of inflammation. There is a small appendicolith. The uterus and adnexal regions appear unremarkable. There is a large amount of fecal matter in the colon that could possibly be symptomatic. No sign of bowel inflammation.  IMPRESSION: No acute finding other than a large amount of fecal matter in the colon. Previous cholecystectomy. As noted previously, there is a small appendicolith but no sign of appendiceal inflammation.   Original Report Authenticated By: Thomasenia Sales, M.D.            Pixie Casino, PA-C 09/29/11 2050

## 2011-09-30 NOTE — ED Provider Notes (Signed)
Medical screening examination/treatment/procedure(s) were performed by non-physician practitioner and as supervising physician I was immediately available for consultation/collaboration.   Carleene Cooper III, MD 09/30/11 1640

## 2011-09-30 NOTE — ED Provider Notes (Signed)
.  Medical screening examination/treatment/procedure(s) were performed by non-physician practitioner and as supervising physician I was immediately available for consultation/collaboration.   Carleene Cooper III, MD 09/30/11 [phone]

## 2011-10-01 LAB — GC/CHLAMYDIA PROBE AMP, GENITAL: Chlamydia, DNA Probe: NEGATIVE

## 2011-10-06 ENCOUNTER — Emergency Department (HOSPITAL_COMMUNITY)
Admission: EM | Admit: 2011-10-06 | Discharge: 2011-10-07 | Disposition: A | Discharge status: Home / Self Care | Payer: Self-pay | Attending: Emergency Medicine | Admitting: Emergency Medicine

## 2011-10-06 ENCOUNTER — Encounter (HOSPITAL_COMMUNITY): Payer: Self-pay | Admitting: *Deleted

## 2011-10-06 DIAGNOSIS — R109 Unspecified abdominal pain: Secondary | ICD-10-CM | POA: Insufficient documentation

## 2011-10-06 DIAGNOSIS — R5383 Other fatigue: Secondary | ICD-10-CM | POA: Insufficient documentation

## 2011-10-06 DIAGNOSIS — Z87891 Personal history of nicotine dependence: Secondary | ICD-10-CM | POA: Insufficient documentation

## 2011-10-06 DIAGNOSIS — R531 Weakness: Secondary | ICD-10-CM

## 2011-10-06 DIAGNOSIS — F411 Generalized anxiety disorder: Secondary | ICD-10-CM | POA: Insufficient documentation

## 2011-10-06 DIAGNOSIS — F419 Anxiety disorder, unspecified: Secondary | ICD-10-CM

## 2011-10-06 DIAGNOSIS — R55 Syncope and collapse: Secondary | ICD-10-CM | POA: Insufficient documentation

## 2011-10-06 DIAGNOSIS — R5381 Other malaise: Secondary | ICD-10-CM | POA: Insufficient documentation

## 2011-10-06 LAB — CBC WITH DIFFERENTIAL/PLATELET
Eosinophils Absolute: 0.1 10*3/uL (ref 0.0–0.7)
Eosinophils Relative: 1 % (ref 0–5)
HCT: 40.4 % (ref 36.0–46.0)
Hemoglobin: 13.8 g/dL (ref 12.0–15.0)
Lymphocytes Relative: 35 % (ref 12–46)
Lymphs Abs: 3.2 10*3/uL (ref 0.7–4.0)
MCH: 29.1 pg (ref 26.0–34.0)
MCV: 85.2 fL (ref 78.0–100.0)
Monocytes Relative: 8 % (ref 3–12)
Platelets: 281 10*3/uL (ref 150–400)
RBC: 4.74 MIL/uL (ref 3.87–5.11)
WBC: 9 10*3/uL (ref 4.0–10.5)

## 2011-10-06 LAB — COMPREHENSIVE METABOLIC PANEL
ALT: 11 U/L (ref 0–35)
Alkaline Phosphatase: 80 U/L (ref 39–117)
BUN: 13 mg/dL (ref 6–23)
CO2: 24 mEq/L (ref 19–32)
Calcium: 9.8 mg/dL (ref 8.4–10.5)
GFR calc Af Amer: >90 mL/min (ref >90)
GFR calc non Af Amer: >90 mL/min (ref >90)
Glucose, Bld: 93 mg/dL (ref 70–99)
Sodium: 137 mEq/L (ref 135–145)

## 2011-10-06 LAB — GLUCOSE, CAPILLARY: Glucose-Capillary: 95 mg/dL (ref 70–99)

## 2011-10-06 LAB — URINALYSIS, ROUTINE W REFLEX MICROSCOPIC
Glucose, UA: NEGATIVE mg/dL
Hgb urine dipstick: NEGATIVE
Protein, ur: NEGATIVE mg/dL
pH: 7 (ref 5.0–8.0)

## 2011-10-06 NOTE — ED Notes (Signed)
The pt was working she became dizzy  Rapid breathing and she felt like she could not breathe.  She took   Puffs from her inhaler and that did not help.  At present she is still breathing a little fast but calmer.  She has had panic attacks in the past.  She last ate at 1600

## 2011-10-07 MED ORDER — LORAZEPAM 1 MG PO TABS: 1.0000 mg | ORAL_TABLET | Freq: Three times a day (TID) | ORAL | Status: DC | PRN

## 2011-10-07 NOTE — ED Notes (Signed)
Pt sats stayed at 100% upon ambulating.

## 2011-10-07 NOTE — ED Notes (Signed)
Prescription given with discharge instructions. Pt. Verbalized understanding of discharge instructions. No respiratory distress noted at this time. Pt resting calmly in bed.

## 2011-10-07 NOTE — ED Provider Notes (Signed)
History     CSN: [id number]  Arrival date & time 10/06/11  4098   First MD Initiated Contact with Patient 10/07/11 0002      Chief Complaint  Patient presents with  . Weakness    (Consider location/radiation/quality/duration/timing/severity/associated sxs/prior treatment) HPI 26 year old female presents emergency apartment complaining of dizziness and weakness. Patient was seen in the emergency department on the 14th with complaint of abdominal pain, vomiting blood in vaginal discharge. Her workup there was unremarkable aside from constipation. Patient was put on MiraLAX, and told of her results. Patient has not taken MiraLAX, as she does not feel that she is constipated. She presents today with generalized weakness and feeling like she is going to pass out. 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 Patient reports she gets these symptoms often where she feels dizzy sees her vision narrowed down. She has learned that if she sits down takes deep breaths her symptoms improve. Patient reports she feels like she is going to die sometimes, and notices that her breathing becomes very fast. Patient had an episode on the ride over here thought she could not catch her breath. She thinks she she was diagnosed with anxiety at one point time, was on some medication but she generally. She reports that she is feeling much better now. Patient used a friend's inhaler, but this did not help her symptoms.  History reviewed. No pertinent past medical history.  Past Surgical History  Procedure Date  . Cholecystectomy   . Hernia repair     No family history on file.  History  Substance Use Topics  . Smoking status: Former Games developer  . Smokeless tobacco: Not on file  . Alcohol Use: Yes     ocassionally    OB History    Grav Para Term Preterm Abortions TAB SAB Ect Mult Living                  Review of Systems  All other systems reviewed and are  negative.    Allergies  Review of patient's allergies indicates no known allergies.  Home Medications   Current Outpatient Rx  Name Route Sig Dispense Refill  . LORAZEPAM 1 MG PO TABS Oral Take 1 tablet (1 mg total) by mouth every 8 (eight) hours as needed for anxiety. 10 tablet 0    BP 121/72  Pulse 66  Temp 98.4 F (36.9 C) (Oral)  Resp 20  SpO2 100%  LMP 09/18/2011  Physical Exam  Nursing note and vitals reviewed. Constitutional: She is oriented to person, place, and time. She appears well-developed and well-nourished.  HENT:  Head: Normocephalic and atraumatic.  Right Ear: External ear normal.  Left Ear: External ear normal.  Nose: Nose normal.  Mouth/Throat: Oropharynx is clear and moist.  Eyes: Conjunctivae normal and EOM are normal. Pupils are equal, round, and reactive to light.  Neck: Normal range of motion. Neck supple. No JVD present. No tracheal deviation present. No thyromegaly present.  Cardiovascular: Normal rate, regular rhythm, normal heart sounds and intact distal pulses.  Exam reveals no gallop and no friction rub.   No murmur heard. Pulmonary/Chest: Effort normal and breath sounds normal. No stridor. No respiratory distress. She has no wheezes. She has no rales. She exhibits no tenderness.  Abdominal: Soft. Bowel sounds are normal. She exhibits no distension and no mass. There is no tenderness. There is no rebound and no guarding.  Musculoskeletal: Normal range of motion. She  exhibits no edema and no tenderness.  Lymphadenopathy:    She has no cervical adenopathy.  Neurological: She is alert and oriented to person, place, and time. She has normal reflexes. No cranial nerve deficit. She exhibits normal muscle tone. Coordination normal.  Skin: Skin is warm and dry. No rash noted. No erythema. No pallor.  Psychiatric: She has a normal mood and affect. Her behavior is normal. Judgment and thought content normal.    ED Course  Procedures (including  critical care time)  Labs Reviewed  URINALYSIS, ROUTINE W REFLEX MICROSCOPIC - Abnormal; Notable for the following:    APPearance TURBID (*)     All other components within normal limits  COMPREHENSIVE METABOLIC PANEL - Abnormal; Notable for the following:    Total Bilirubin 0.2 (*)     All other components within normal limits  URINE MICROSCOPIC-ADD ON - Abnormal; Notable for the following:    Squamous Epithelial / LPF FEW (*)     All other components within normal limits  PREGNANCY, URINE  CBC WITH DIFFERENTIAL  GLUCOSE, CAPILLARY  LAB REPORT - SCANNED   No results found.   1. Weakness   2. Near syncope   3. Abdominal pain, left lateral   4. Anxiety       MDM  Work appears unremarkable. Patient to followup outpatient with a primary care doctor as well as mental health. Patient updated on findings, and plan.        Olivia Mackie, MD 10/09/11 [phone]

## 2011-10-30 ENCOUNTER — Emergency Department (HOSPITAL_BASED_OUTPATIENT_CLINIC_OR_DEPARTMENT_OTHER): Payer: Self-pay

## 2011-10-30 ENCOUNTER — Encounter (HOSPITAL_BASED_OUTPATIENT_CLINIC_OR_DEPARTMENT_OTHER): Payer: Self-pay

## 2011-10-30 ENCOUNTER — Emergency Department (HOSPITAL_BASED_OUTPATIENT_CLINIC_OR_DEPARTMENT_OTHER)
Admission: EM | Admit: 2011-10-30 | Discharge: 2011-10-30 | Disposition: A | Discharge status: Home / Self Care | Payer: Self-pay | Attending: Emergency Medicine | Admitting: Emergency Medicine

## 2011-10-30 DIAGNOSIS — IMO0002 Reserved for concepts with insufficient information to code with codable children: Secondary | ICD-10-CM | POA: Insufficient documentation

## 2011-10-30 DIAGNOSIS — S46909A Unspecified injury of unspecified muscle, fascia and tendon at shoulder and upper arm level, unspecified arm, initial encounter: Secondary | ICD-10-CM | POA: Insufficient documentation

## 2011-10-30 DIAGNOSIS — S4992XA Unspecified injury of left shoulder and upper arm, initial encounter: Secondary | ICD-10-CM

## 2011-10-30 DIAGNOSIS — S4980XA Other specified injuries of shoulder and upper arm, unspecified arm, initial encounter: Secondary | ICD-10-CM | POA: Insufficient documentation

## 2011-10-30 DIAGNOSIS — R609 Edema, unspecified: Secondary | ICD-10-CM | POA: Insufficient documentation

## 2011-10-30 HISTORY — DX: Major depressive disorder, single episode, unspecified: F32.9

## 2011-10-30 HISTORY — DX: Depression, unspecified: F32.A
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
## 2011-10-30 NOTE — ED Provider Notes (Signed)
Medical screening examination/treatment/procedure(s) were performed by non-physician practitioner and as supervising physician I was immediately available for consultation/collaboration.    Nelia Shi, MD 10/30/11 [phone]

## 2011-12-10 ENCOUNTER — Emergency Department (HOSPITAL_COMMUNITY)
Admission: EM | Admit: 2011-12-10 | Discharge: 2011-12-10 | Disposition: A | Discharge status: Home / Self Care | Payer: Self-pay | Attending: Emergency Medicine | Admitting: Emergency Medicine

## 2011-12-10 ENCOUNTER — Emergency Department (HOSPITAL_COMMUNITY): Payer: Self-pay

## 2011-12-10 ENCOUNTER — Encounter (HOSPITAL_COMMUNITY): Payer: Self-pay | Admitting: Emergency Medicine

## 2011-12-10 DIAGNOSIS — M75100 Unspecified rotator cuff tear or rupture of unspecified shoulder, not specified as traumatic: Secondary | ICD-10-CM

## 2011-12-10 DIAGNOSIS — F3289 Other specified depressive episodes: Secondary | ICD-10-CM | POA: Insufficient documentation

## 2011-12-10 DIAGNOSIS — M67919 Unspecified disorder of synovium and tendon, unspecified shoulder: Secondary | ICD-10-CM | POA: Insufficient documentation

## 2011-12-10 DIAGNOSIS — M719 Bursopathy, unspecified: Secondary | ICD-10-CM | POA: Insufficient documentation

## 2011-12-10 DIAGNOSIS — F329 Major depressive disorder, single episode, unspecified: Secondary | ICD-10-CM | POA: Insufficient documentation

## 2011-12-10 DIAGNOSIS — G8929 Other chronic pain: Secondary | ICD-10-CM | POA: Insufficient documentation

## 2011-12-10 DIAGNOSIS — F17200 Nicotine dependence, unspecified, uncomplicated: Secondary | ICD-10-CM | POA: Insufficient documentation

## 2011-12-10 MED ORDER — HYDROCODONE-ACETAMINOPHEN 5-325 MG PO TABS: ORAL_TABLET | ORAL | Status: DC

## 2011-12-10 MED ORDER — IBUPROFEN 800 MG PO TABS: 800.0000 mg | ORAL_TABLET | Freq: Three times a day (TID) | ORAL | Status: DC

## 2011-12-10 MED ORDER — HYDROCODONE-ACETAMINOPHEN 5-325 MG PO TABS
2.0000 | ORAL_TABLET | Freq: Once | ORAL | Status: AC
  Administered 2011-12-10: 2 via ORAL
  Filled 2011-12-10: qty 2

## 2011-12-10 MED ORDER — IBUPROFEN 400 MG PO TABS
600.0000 mg | ORAL_TABLET | Freq: Once | ORAL | Status: AC
  Administered 2011-12-10: 600 mg via ORAL
  Filled 2011-12-10: qty 1
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
## 2011-12-10 NOTE — Discharge Instructions (Signed)
 Rotator Cuff Tendonitis  The rotator cuff is the collection of all the muscles and tendons (the supraspinatus, infraspinatus, subscapularis, and teres minor muscles and their tendons) that help your shoulder stay in place. This unit holds the head of the upper arm bone (humerus) in the cup (fossa) of the shoulder blade (scapula). Basically, it connects the arm to the shoulder. Tendinitis is a swelling and irritation of the tissue, called cord like structures (tendons) that connect muscle to bone. It usually is caused by overusing the joint involved. When the tissue surrounding a tendon (the synovium) becomes inflamed, it is called tenosynovitis. This also is often the result of overuse in people whose jobs require repetitive (over and over again) types of motion. HOME CARE INSTRUCTIONS   Use a sling or splint for as long as directed by your caregiver until the pain decreases.  Apply ice to the injury for 15 to 20 minutes, 3 to 4 times per day. Put the ice in a plastic bag and place a towel between the bag of ice and your skin.  Try to avoid use other than gentle range of motion while your shoulder is painful. Use and exercise only as directed by your caregiver. Stop exercises or range of motion if pain or discomfort increases, unless directed otherwise by your caregiver.  Only take over-the-counter or prescription medicines for pain, discomfort, or fever as directed by your caregiver.  If you were give a shoulder sling and straps (immobilizer), do not remove it except as directed, or until you see a caregiver for a follow-up examination. If you need to remove it, move your arm as little as possible or as directed.  You may want to sleep on several pillows at night to lessen swelling and pain. SEEK IMMEDIATE MEDICAL CARE IF:   Pain in your shoulder increases or new pain develops in your arm, hand, or fingers and is not relieved with medications.  You develop new, unexplained symptoms, especially  increased numbness in the hands or loss of strength, or you develop any worsening of the problems which brought you in for care.  Your arm, hand, or fingers are numb or tingling.  Your arm, hand, or fingers are swollen, painful, or turn white or blue. Document Released: 03/24/2003 Document Revised: 03/26/2011 Document Reviewed: 10/30/2007 [facility] Patient Information 2013 [place], [place].    Narcotic and benzodiazepine use may cause drowsiness, slowed breathing or dependence.  Please use with caution and do not drive, operate machinery or watch young children alone while taking them.  Taking combinations of these medications or drinking alcohol will potentiate these effects.

## 2012-01-30 ENCOUNTER — Encounter (HOSPITAL_COMMUNITY): Payer: Self-pay | Admitting: Emergency Medicine

## 2012-01-30 ENCOUNTER — Emergency Department (HOSPITAL_COMMUNITY)
Admission: EM | Admit: 2012-01-30 | Discharge: 2012-01-30 | Disposition: A | Discharge status: Home / Self Care | Payer: Self-pay | Attending: Emergency Medicine | Admitting: Emergency Medicine

## 2012-01-30 DIAGNOSIS — R52 Pain, unspecified: Secondary | ICD-10-CM | POA: Insufficient documentation

## 2012-01-30 DIAGNOSIS — Z8659 Personal history of other mental and behavioral disorders: Secondary | ICD-10-CM | POA: Insufficient documentation

## 2012-01-30 DIAGNOSIS — R509 Fever, unspecified: Secondary | ICD-10-CM | POA: Insufficient documentation

## 2012-01-30 DIAGNOSIS — Z791 Long term (current) use of non-steroidal anti-inflammatories (NSAID): Secondary | ICD-10-CM | POA: Insufficient documentation

## 2012-01-30 DIAGNOSIS — R5381 Other malaise: Secondary | ICD-10-CM | POA: Insufficient documentation

## 2012-01-30 DIAGNOSIS — F17200 Nicotine dependence, unspecified, uncomplicated: Secondary | ICD-10-CM | POA: Insufficient documentation

## 2012-01-30 DIAGNOSIS — J029 Acute pharyngitis, unspecified: Secondary | ICD-10-CM | POA: Insufficient documentation

## 2012-01-30 DIAGNOSIS — M2550 Pain in unspecified joint: Secondary | ICD-10-CM | POA: Insufficient documentation

## 2012-01-30 DIAGNOSIS — Z79899 Other long term (current) drug therapy: Secondary | ICD-10-CM | POA: Insufficient documentation

## 2012-01-30 DIAGNOSIS — R630 Anorexia: Secondary | ICD-10-CM | POA: Insufficient documentation

## 2012-01-30 LAB — RAPID STREP SCREEN (MED CTR MEBANE ONLY): Streptococcus, Group A Screen (Direct): NEGATIVE

## 2012-01-30 MED ORDER — HYDROCODONE-ACETAMINOPHEN 7.5-500 MG/15ML PO SOLN
10.0000 mL | Freq: Once | ORAL | Status: AC
  Administered 2012-01-30: 10 mL via ORAL
  Filled 2012-01-30: qty 15

## 2012-01-30 MED ORDER — HYDROCODONE-ACETAMINOPHEN 7.5-500 MG/15ML PO SOLN: 15.0000 mL | Freq: Four times a day (QID) | ORAL | Status: DC | PRN

## 2012-01-30 NOTE — ED Provider Notes (Signed)
History     CSN: [id number]  Arrival date & time 01/30/12  4098   First MD Initiated Contact with Patient 01/30/12 1004      Chief Complaint  Patient presents with  . Sore Throat  . Generalized Body Aches    (Consider location/radiation/quality/duration/timing/severity/associated sxs/prior treatment) Patient is a 27 y.o. female presenting with pharyngitis. The history is provided by the patient. No language interpreter was used.  Sore Throat This is a new problem. The current episode started yesterday. The problem occurs constantly. The problem has been gradually worsening. Associated symptoms include anorexia, chills, fatigue, a fever, myalgias, neck pain, a sore throat and swollen glands. Pertinent negatives include no abdominal pain, arthralgias, change in bowel habit, chest pain, congestion, coughing, headaches, nausea, rash or vomiting. The symptoms are aggravated by drinking, eating and swallowing. She has tried acetaminophen for the symptoms. The treatment provided no relief.    Past Medical History  Diagnosis Date  . Depression     Past Surgical History  Procedure Date  . Cholecystectomy   . Hernia repair     No family history on file.  History  Substance Use Topics  . Smoking status: Current Some Day Smoker  . Smokeless tobacco: Not on file  . Alcohol Use: Yes     Comment: ocassionally    OB History    Grav Para Term Preterm Abortions TAB SAB Ect Mult Living                  Review of Systems  Constitutional: Positive for fever, chills and fatigue.       10 Systems reviewed and all are negative for acute change except as noted in the HPI.   HENT: Positive for sore throat and neck pain. Negative for congestion.   Respiratory: Negative for cough.   Cardiovascular: Negative for chest pain.  Gastrointestinal: Positive for anorexia. Negative for nausea, vomiting, abdominal pain and change in bowel habit.  Musculoskeletal: Positive for myalgias. Negative for  arthralgias.  Skin: Negative for rash.  Neurological: Negative for headaches.    Allergies  Review of patient's allergies indicates no known allergies.  Home Medications   Current Outpatient Rx  Name  Route  Sig  Dispense  Refill  . ACETAMINOPHEN 500 MG PO TABS   Oral   Take 1,000 mg by mouth every 6 (six) hours as needed. For pain         . HYDROCODONE-ACETAMINOPHEN 5-325 MG PO TABS      1-2 tablets po q 6 hours prn moderate to severe pain   20 tablet   0   . IBUPROFEN 800 MG PO TABS   Oral   Take 1 tablet (800 mg total) by mouth 3 (three) times daily with meals.   21 tablet   0     BP 133/68  Pulse 108  Temp 98.9 F (37.2 C) (Oral)  Resp 18  SpO2 98%  Physical Exam  Nursing note and vitals reviewed. Constitutional: She is oriented to person, place, and time. She appears well-developed and well-nourished. No distress.  HENT:  Head: Normocephalic and atraumatic.  Right Ear: External ear normal.  Left Ear: External ear normal.  Nose: Nose normal.  Mouth/Throat: No oropharyngeal exudate.       Post oropharyngeal erythema without exudates.  Uvula midline, no tonsillar enlargement.  No evidence of deep tissue infection  Eyes: Conjunctivae normal are normal.  Neck: Normal range of motion. Neck supple.  Shoddy R cervical lymphadenopathy, ttp  Cardiovascular: Normal rate and regular rhythm.   Pulmonary/Chest: Effort normal and breath sounds normal. She has no wheezes. She exhibits no tenderness.  Abdominal: There is no tenderness.  Lymphadenopathy:    She has cervical adenopathy.  Neurological: She is alert and oriented to person, place, and time.  Skin: Skin is warm. No rash noted.  Psychiatric: She has a normal mood and affect.    ED Course  Procedures (including critical care time)   Labs Reviewed  RAPID STREP SCREEN   No results found.   No diagnosis found.  Results for orders placed during the hospital encounter of 01/30/12  RAPID STREP  SCREEN      Component Value Range   Streptococcus, Group A Screen (Direct) NEGATIVE  NEGATIVE   No results found.  1. Pharyngitis, viral  MDM  Pt presents with sore throat, neck pain, fever, and no cough.  No evidence concerning for deep tissue infection.  Pt tolerates own saliva.  She's afebrile here.  Rapid strep test ordered.   10:50 AM Rapid strep test neg.  Pt able to tolerates oral secretion, no voice changes.  Is afebrile, no abd pain.     BP 133/68  Pulse 108  Temp 98.9 F (37.2 C) (Oral)  Resp 18  SpO2 98%  I have reviewed nursing notes and vital signs.  I reviewed available ER/hospitalization records thought the EMR       Fayrene Helper, [place] 01/30/12 1059

## 2012-01-30 NOTE — ED Provider Notes (Signed)
Medical screening examination/treatment/procedure(s) were performed by non-physician practitioner and as supervising physician I was immediately available for consultation/collaboration.   Daksh Coates, MD 01/30/12 1438 

## 2012-01-30 NOTE — ED Notes (Signed)
Onset one day ago sore throat, lump right side of neck, general body achy, and possible fever. Throat pain 6/10 achy dull.

## 2012-01-31 ENCOUNTER — Encounter (HOSPITAL_COMMUNITY): Payer: Self-pay | Admitting: Family Medicine

## 2012-01-31 ENCOUNTER — Emergency Department (HOSPITAL_COMMUNITY)
Admission: EM | Admit: 2012-01-31 | Discharge: 2012-01-31 | Disposition: A | Discharge status: Home / Self Care | Payer: Self-pay | Attending: Emergency Medicine | Admitting: Emergency Medicine

## 2012-01-31 DIAGNOSIS — F17200 Nicotine dependence, unspecified, uncomplicated: Secondary | ICD-10-CM | POA: Insufficient documentation

## 2012-01-31 DIAGNOSIS — R52 Pain, unspecified: Secondary | ICD-10-CM | POA: Insufficient documentation

## 2012-01-31 DIAGNOSIS — R509 Fever, unspecified: Secondary | ICD-10-CM | POA: Insufficient documentation

## 2012-01-31 DIAGNOSIS — J039 Acute tonsillitis, unspecified: Secondary | ICD-10-CM | POA: Insufficient documentation

## 2012-01-31 DIAGNOSIS — Z8659 Personal history of other mental and behavioral disorders: Secondary | ICD-10-CM | POA: Insufficient documentation

## 2012-01-31 DIAGNOSIS — H9209 Otalgia, unspecified ear: Secondary | ICD-10-CM | POA: Insufficient documentation

## 2012-01-31 LAB — RAPID STREP SCREEN (MED CTR MEBANE ONLY): Streptococcus, Group A Screen (Direct): NEGATIVE

## 2012-01-31 MED ORDER — ACETAMINOPHEN 325 MG PO TABS
ORAL_TABLET | ORAL | Status: AC
  Filled 2012-01-31: qty 2

## 2012-01-31 MED ORDER — ACETAMINOPHEN 325 MG PO TABS
650.0000 mg | ORAL_TABLET | Freq: Four times a day (QID) | ORAL | Status: DC | PRN
  Administered 2012-01-31: 22:00:00 via ORAL

## 2012-01-31 MED ORDER — PENICILLIN G BENZATHINE 1200000 UNIT/2ML IM SUSP
1.2000 10*6.[IU] | Freq: Once | INTRAMUSCULAR | Status: AC
  Administered 2012-01-31: 1.2 10*6.[IU] via INTRAMUSCULAR
  Filled 2012-01-31: qty 2

## 2012-01-31 NOTE — ED Notes (Signed)
Patient states she has been running a fever for the past 2 days. Was seen yesterday for fever and sore throat and is no better.

## 2012-01-31 NOTE — ED Notes (Signed)
[id number]<QM> Expected date:<BR> Expected time:<BR> Means of arrival:<BR> Comments:<BR> Fast track closes at 1100

## 2012-01-31 NOTE — ED Provider Notes (Signed)
History  Scribed for Brooke Anis, PA-C/ Brooke Booze, MD, the patient was seen in room WTR7/WTR7. This chart was scribed by Candelaria Stagers. The patient's care started at 10:20 PM   CSN: [id number]  Arrival date & time 01/31/12  2103   First MD Initiated Contact with Patient 01/31/12 2209      Chief Complaint  Patient presents with  . Fever  . Sore Throat     The history is provided by the patient. No language interpreter was used.   Brooke Morgan is a 27 y.o. female who presents to the Emergency Department complaining of sore throat, fever, and body aches that started .  She is also experiencing ear pain.  She denies rash, nausea, vomiting, nasal congestion, or cough.  She has had no ill contacts.  Pt has had strep before and states these sx feel the same.  Nothing seems to make the sx better or worse.    Past Medical History  Diagnosis Date  . Depression     Past Surgical History  Procedure Date  . Cholecystectomy   . Hernia repair     No family history on file.  History  Substance Use Topics  . Smoking status: Current Some Day Smoker  . Smokeless tobacco: Not on file  . Alcohol Use: Yes     Comment: ocassionally    OB History    Grav Para Term Preterm Abortions TAB SAB Ect Mult Living                  Review of Systems  Constitutional: Positive for fever and chills.  HENT: Positive for ear pain and sore throat. Negative for congestion.   Respiratory: Negative for cough and shortness of breath.   Gastrointestinal: Negative for nausea, vomiting and abdominal pain.  Skin: Negative for rash.  Neurological: Negative for weakness.  All other systems reviewed and are negative.    Allergies  Review of patient's allergies indicates no known allergies.  Home Medications   Current Outpatient Rx  Name  Route  Sig  Dispense  Refill  . HYDROCODONE-ACETAMINOPHEN 7.5-500 MG/15ML PO SOLN   Oral   Take 15 mLs by mouth every 6 (six) hours as needed for pain.  120 mL   0     BP 108/76  Pulse 125  Temp 103 F (39.4 C) (Oral)  Resp 22  SpO2 97%  LMP 01/26/2012  Physical Exam  Nursing note and vitals reviewed. Constitutional: She is oriented to person, place, and time. She appears well-developed and well-nourished. No distress.  HENT:  Head: Normocephalic and atraumatic.  Right Ear: Tympanic membrane normal.  Left Ear: Tympanic membrane normal.  Mouth/Throat: Oropharyngeal exudate present.       Uvula midline.  Pharyngeal erythematous with swelling.    Eyes: EOM are normal.  Neck: Neck supple. No tracheal deviation present.  Cardiovascular: Normal rate and regular rhythm.   Pulmonary/Chest: Effort normal. No respiratory distress. She has no wheezes. She has no rales.  Abdominal: Soft. There is no tenderness.  Musculoskeletal: Normal range of motion.  Neurological: She is alert and oriented to person, place, and time.  Skin: Skin is warm and dry.  Psychiatric: She has a normal mood and affect. Her behavior is normal.    ED Course  Procedures   DIAGNOSTIC STUDIES: Oxygen Saturation is 97% on room air, normal by my interpretation.    COORDINATION OF CARE:  9:49PM Ordered: Rapid strep screen    Labs Reviewed  RAPID STREP  SCREEN   No results found.   No diagnosis found.  1. Tonsillitis   MDM  The patient had a rapid strep done that was negative, however, she has a significantly sore throat that is erythematous with bilateral exudates and a history of strep with similar symptoms. Opt to treat with abx and supportive care.   I personally performed the services described in this documentation, which was scribed in my presence. The recorded information has been reviewed and is accurate.    Arnoldo Hooker, PA-C 01/31/12 2330

## 2012-02-01 NOTE — ED Provider Notes (Signed)
Medical screening examination/treatment/procedure(s) were performed by non-physician practitioner and as supervising physician I was immediately available for consultation/collaboration.   Dione Booze, MD 02/01/12 [phone]

## 2013-10-14 IMAGING — CT CT ABD-PELV W/ CM
2 of 4 series · 14 of 32 positions shown, 19 images · IV contrast (water/omni  & 80ml omni 300)
Comparison: 07/12/2011.  Ultrasound today.

CLINICAL DATA: Days of lower abdominal pain, most severe in the
left lower quadrant.  The

CT ABDOMEN AND PELVIS WITH CONTRAST
TECHNIQUE: Multidetector CT imaging of the abdomen and pelvis was
performed following the standard protocol during bolus
administration of intravenous contrast.
Contrast: 80mL OMNIPAQUE IOHEXOL 300 MG/ML  SOLN

[Series 2: routine abdomen · axial · 0.70mm/px · z∈[-430,-134]mm · 7 of 79 slices shown, 12 images]
[im 10/79  soft-tissue]
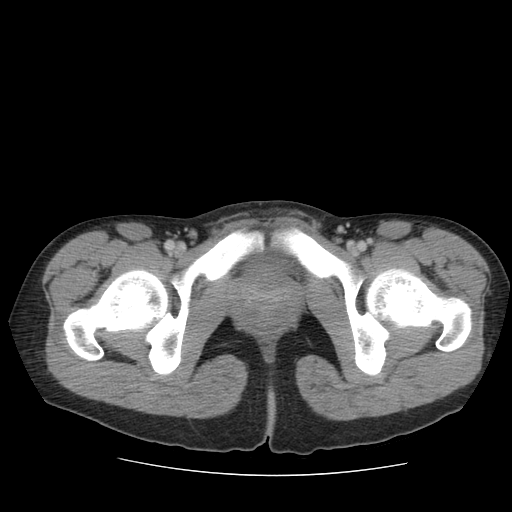
[im 10/79  bone]
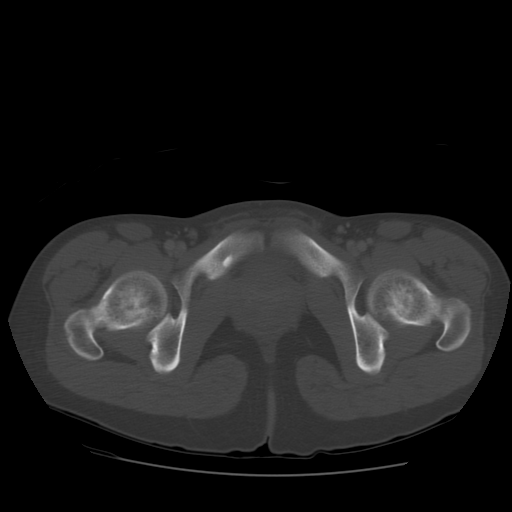
[im 20/79  soft-tissue]
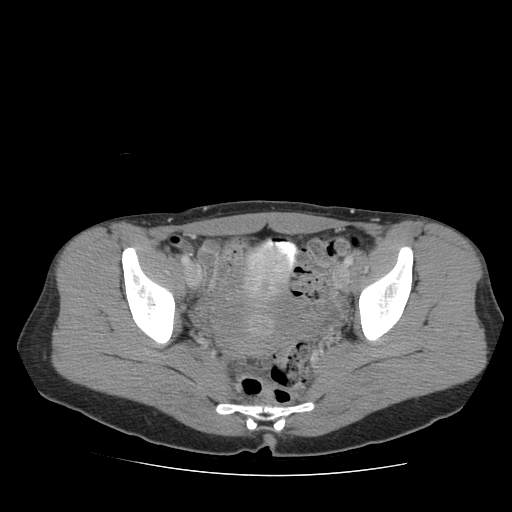
[im 30/79  soft-tissue]
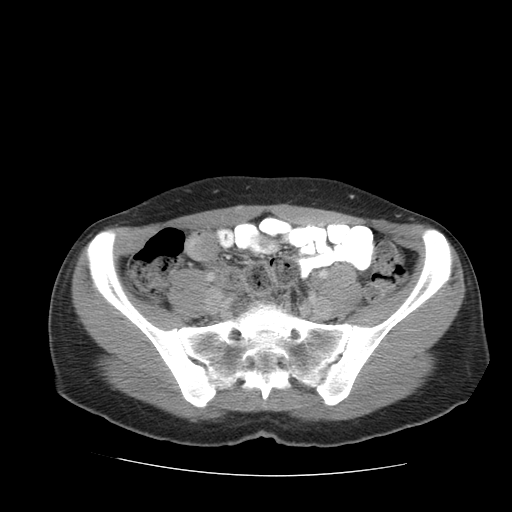
[im 40/79  soft-tissue]
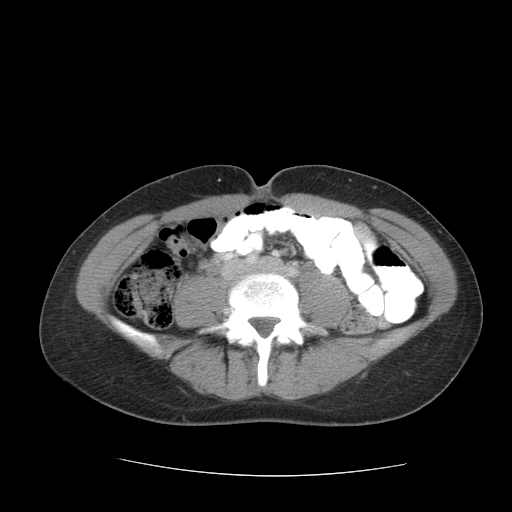
[im 40/79  lung]
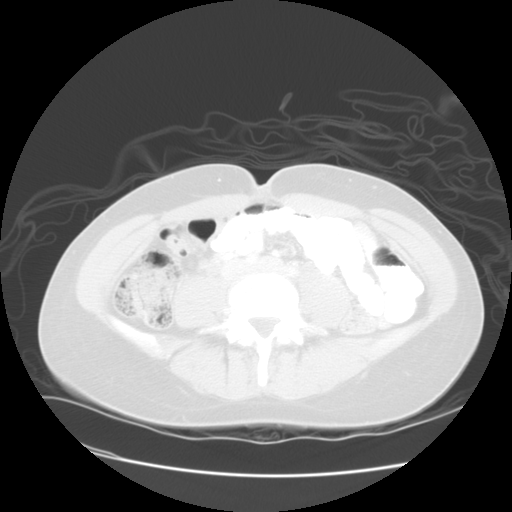
[im 49/79  soft-tissue]
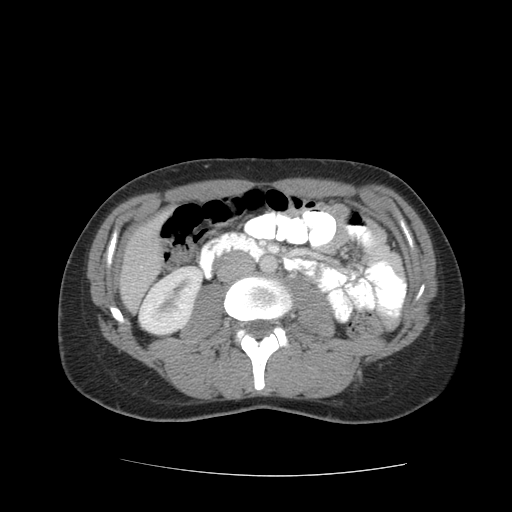
[im 49/79  lung]
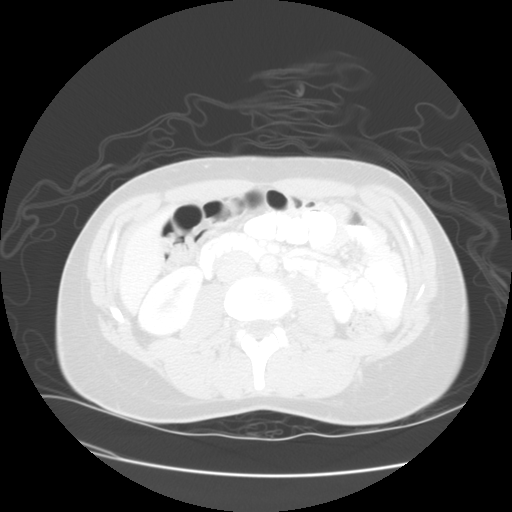
[im 59/79  soft-tissue]
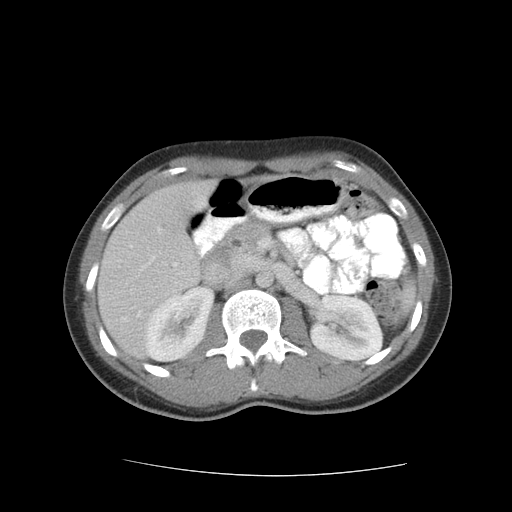
[im 59/79  lung]
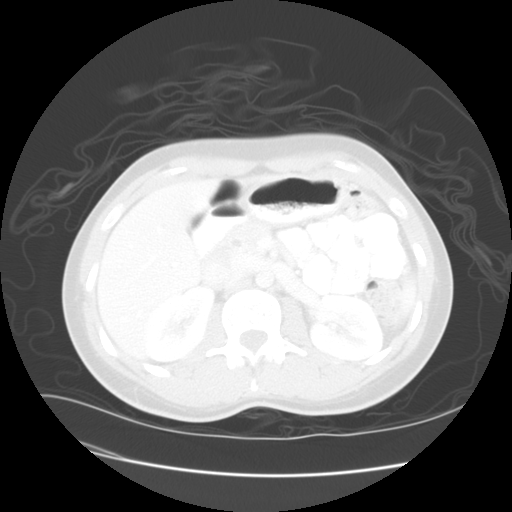
[im 69/79  soft-tissue]
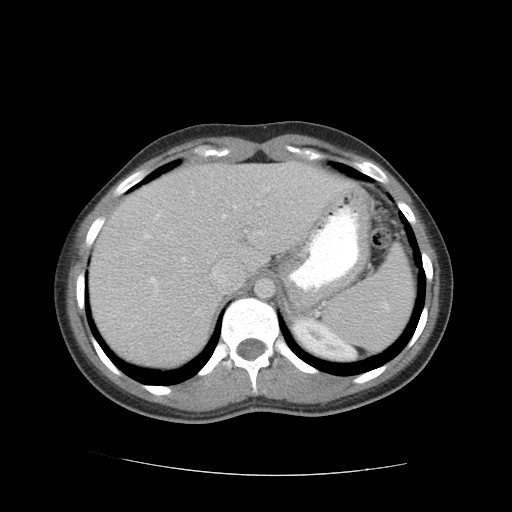
[im 69/79  lung]
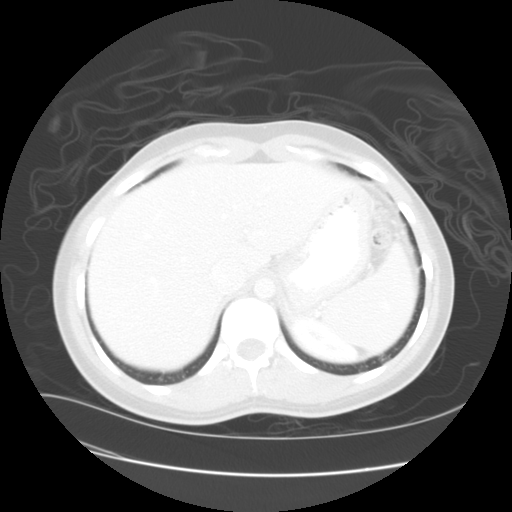

[Series 401: sagitals · sagittal · 0.78mm/px · 7 of 97 slices shown]
[im 9/97  soft-tissue]
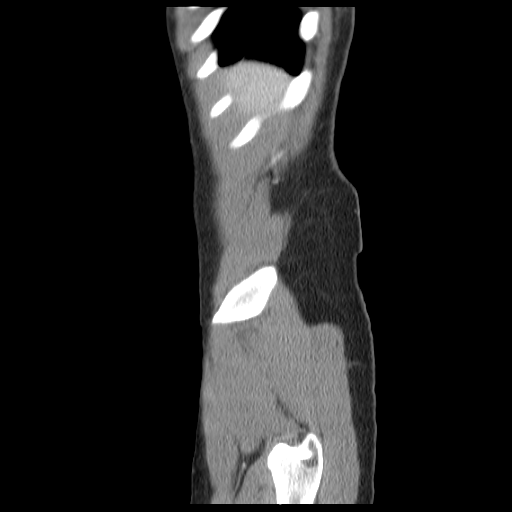
[im 18/97  soft-tissue]
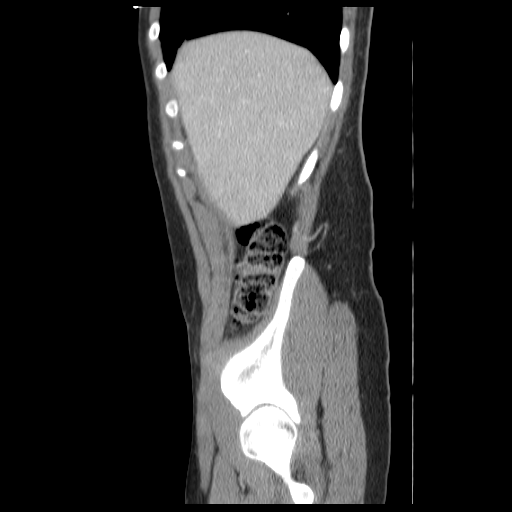
[im 35/97  soft-tissue]
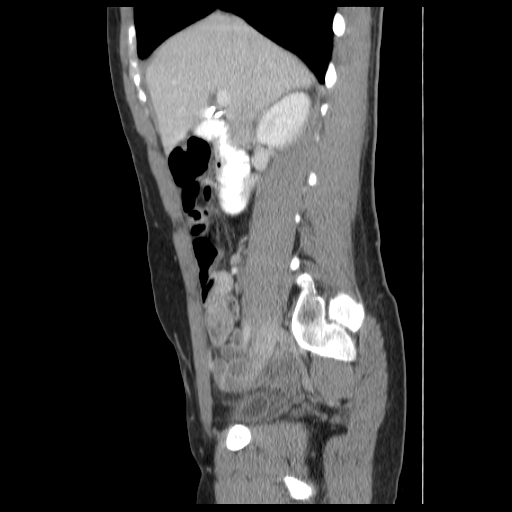
[im 44/97  soft-tissue]
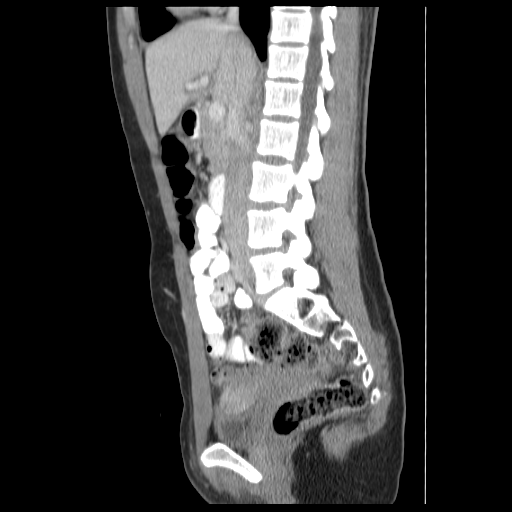
[im 53/97  soft-tissue]
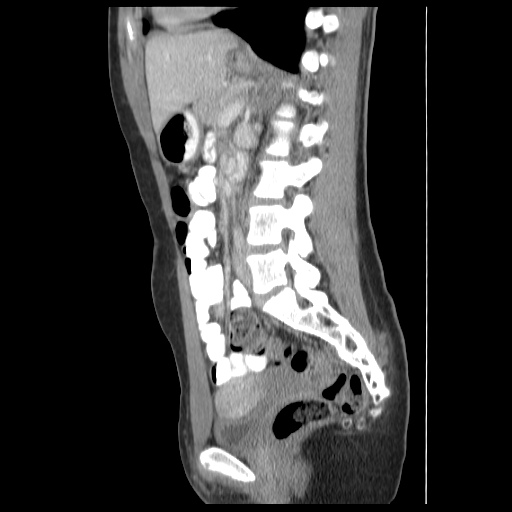
[im 62/97  soft-tissue]
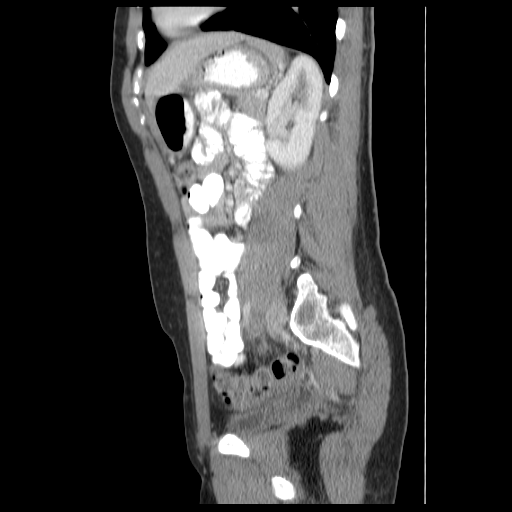
[im 79/97  soft-tissue]
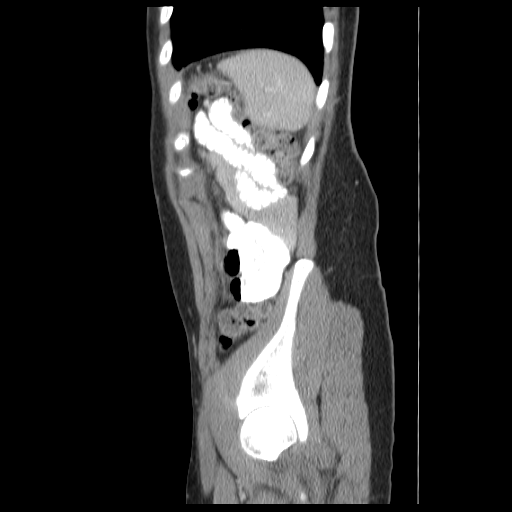

[14 of 32 positions shown; findings below may reference images not displayed]

FINDINGS: Lung bases are clear.  No pleural or pericardial fluid.
The liver has a normal appearance.  There is been previous
cholecystectomy.  The spleen is normal.  The pancreas is normal.
The adrenal glands are normal.  The kidneys are normal.  No cyst,
mass, stone or hydronephrosis.  The aorta and IVC are normal.  No
retroperitoneal mass or adenopathy.  No free intraperitoneal fluid
or air.  The appendix does not show any signs of inflammation.
There is a small appendicolith.  The uterus and adnexal regions
appear unremarkable.  There is a large amount of fecal matter in
the colon that could possibly be symptomatic.  No sign of bowel
inflammation.
IMPRESSION: No acute finding other than a large amount of fecal matter in the
colon. Previous cholecystectomy.  As noted previously, there is a
small appendicolith but no sign of appendiceal inflammation.

## 2013-10-14 IMAGING — US US TRANSVAGINAL NON-OB
1 series · 13 of 25 positions shown · non-contrast
Comparison: 07/12/2011

CLINICAL DATA: Pelvic pain, query ovarian torsion or tubo-ovarian
abscess.

TRANSABDOMINAL AND TRANSVAGINAL ULTRASOUND OF PELVIS
DOPPLER ULTRASOUND OF OVARIES
TECHNIQUE: Both transabdominal and transvaginal ultrasound
examinations of the pelvis were performed. Transabdominal technique
was performed for global imaging of the pelvis including uterus,
ovaries, adnexal regions, and pelvic cul-de-sac.
It was necessary to proceed with endovaginal exam following the
transabdominal exam to visualize the uterus, endometrium, and
ovaries..
Color and duplex Doppler ultrasound was utilized to evaluate blood
flow to the ovaries.

[Series 1: us transvaginal non-ob · 0.23mm/px · 13 of 69 slices shown]
[im 1/69]
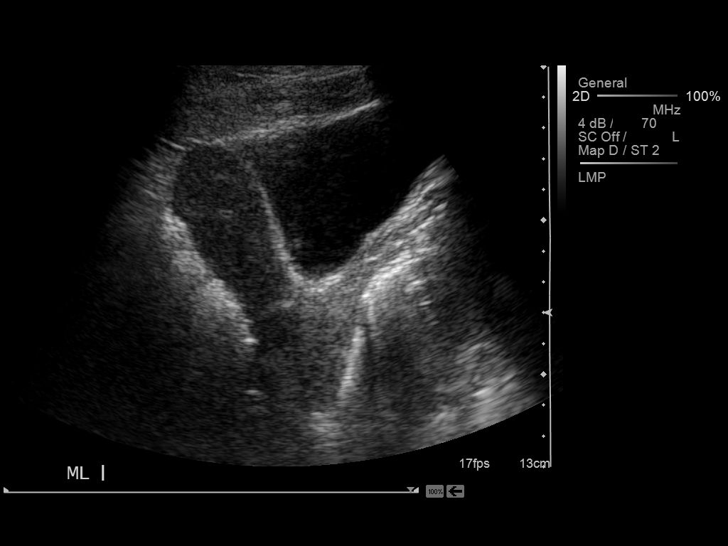
[im 6/69]
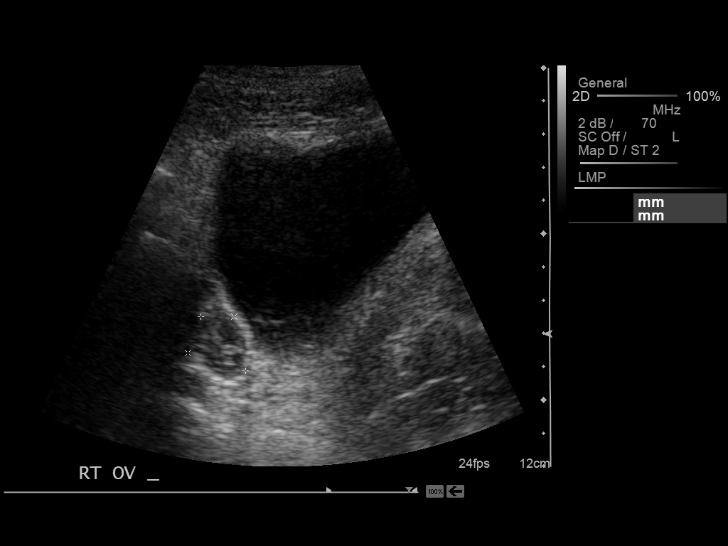
[im 12/69]
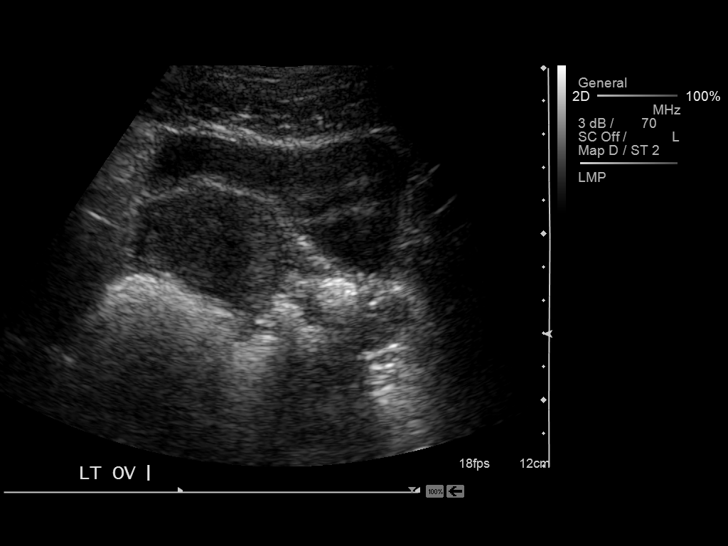
[im 18/69]
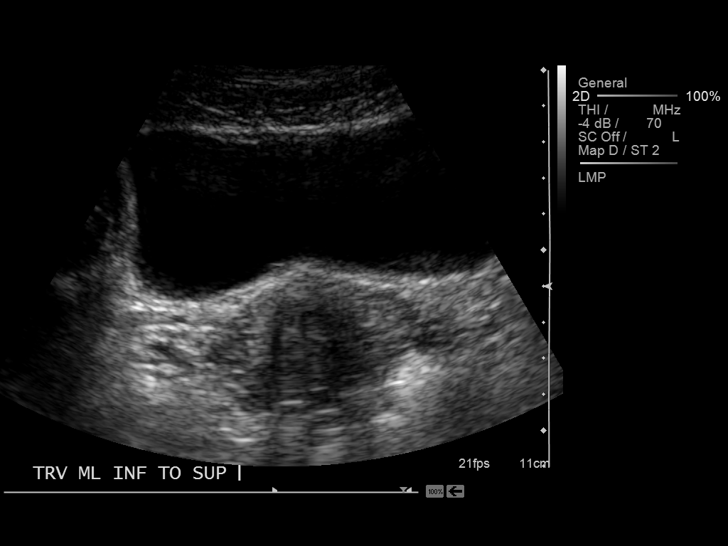
[im 23/69]
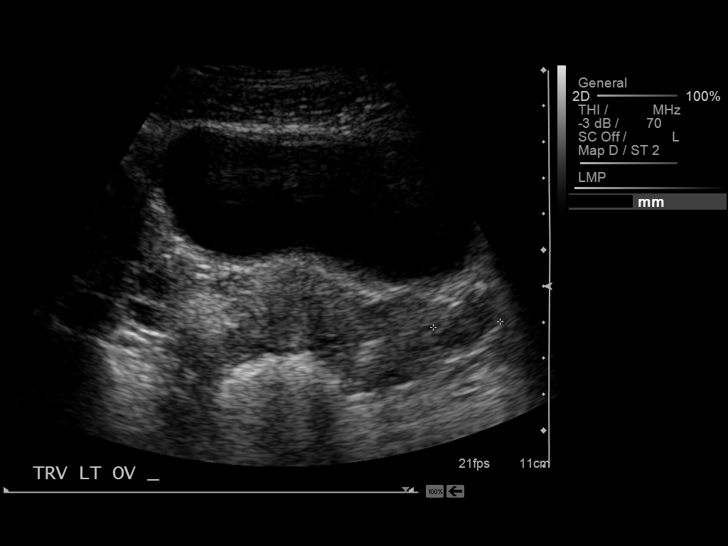
[im 29/69]
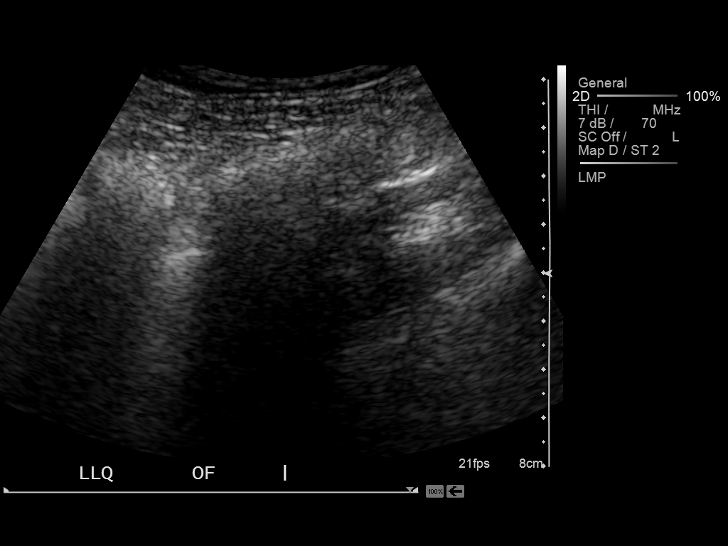
[im 35/69]
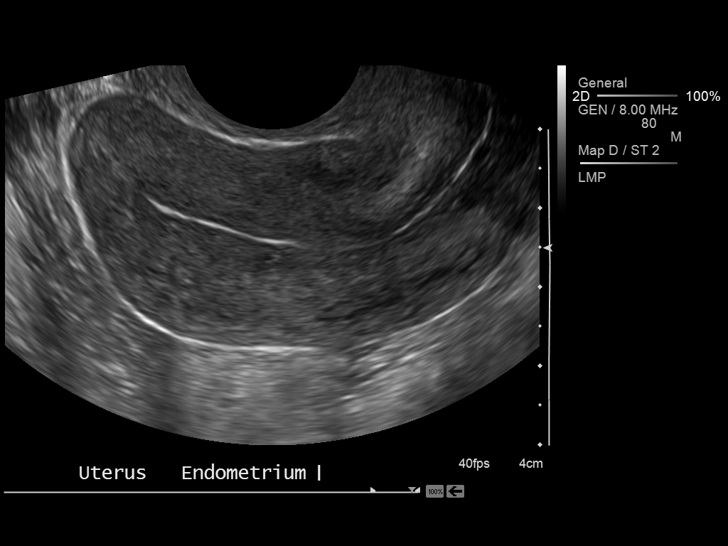
[im 40/69]
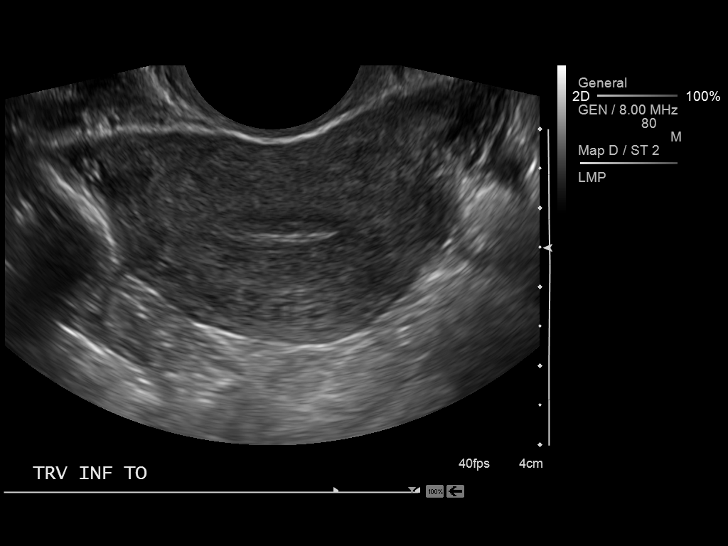
[im 46/69]
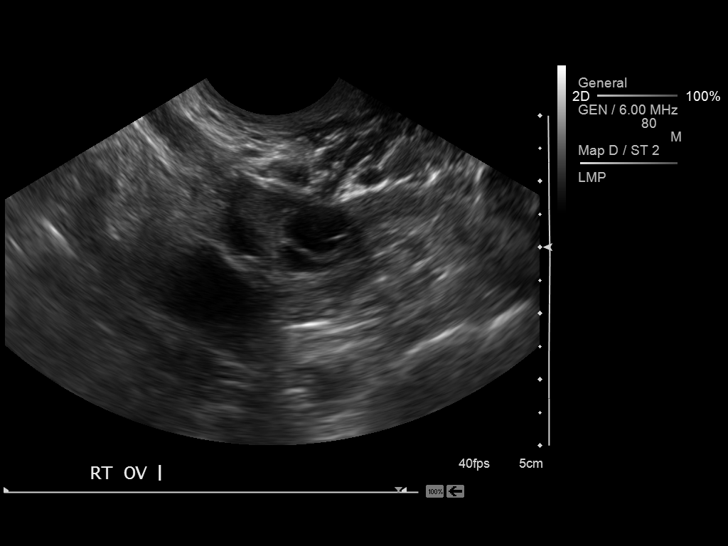
[im 52/69]
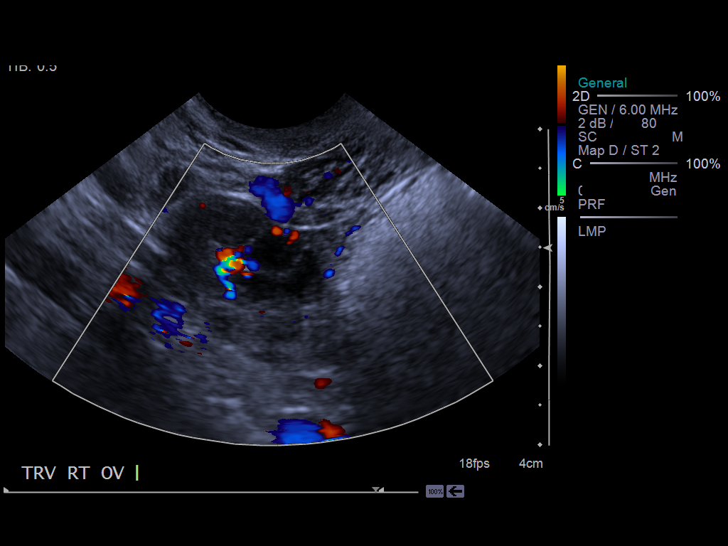
[im 57/69]
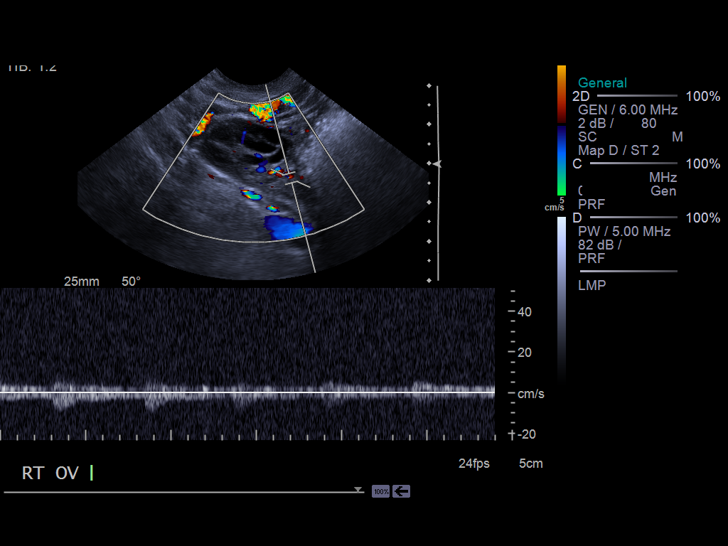
[im 63/69]
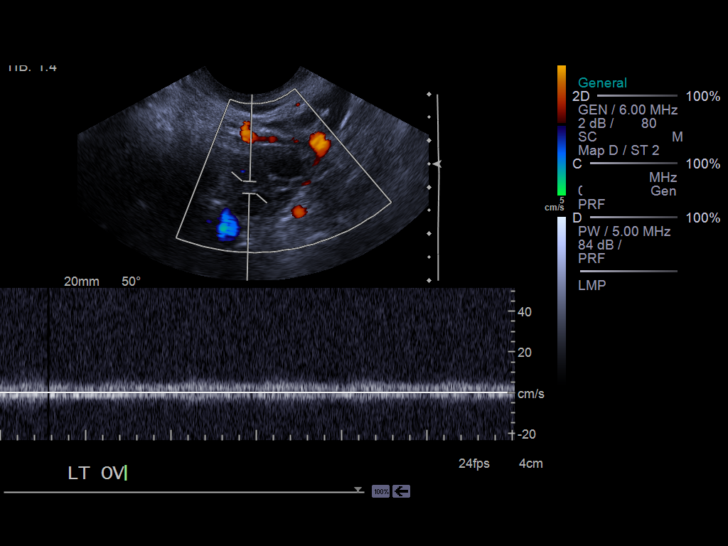
[im 69/69]
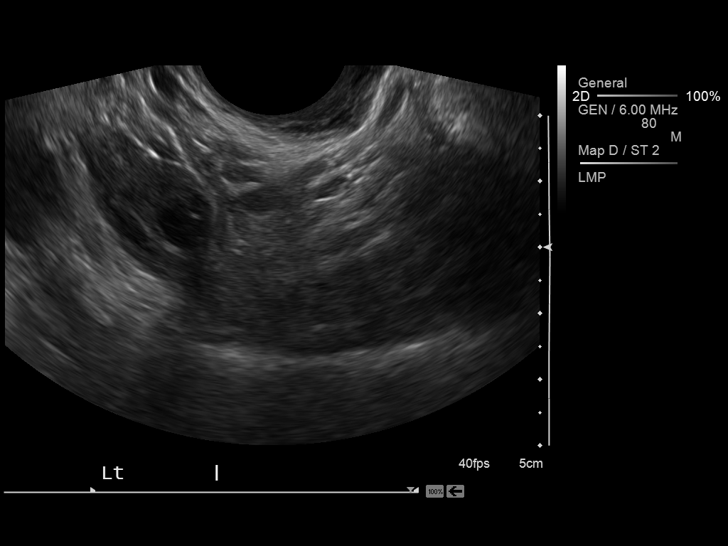

[13 of 25 positions shown; findings below may reference images not displayed]

FINDINGS: Uterus:  On image 43, a 7 mm punctate hyper echogenicity in the
anterior myometrium may reflect an incidental punctate
calcification.  No overt shadowing.  No myometrial mass observed.

Endometrium:  Measures 1 mm in thickness, within normal limits.

Right ovary: Measures 2.3 x 1.5 x 1.9 cm and contains multiple
follicles.  No mass lesion.

Left ovary:    Measures 2.1 x 1.3 x 1.6 cm and contains multiple
follicles.  No mass lesion.

Pulsed Doppler evaluation demonstrates normal low-resistance
arterial and venous waveforms in both ovaries.

Other:  No free pelvic fluid observed.
IMPRESSION: Normal exam.  No evidence of pelvic mass, tubo-ovarian abscess, or
other significant abnormality.

No sonographic evidence for ovarian torsion.

## 2014-09-18 ENCOUNTER — Encounter (HOSPITAL_COMMUNITY): Payer: Self-pay | Admitting: *Deleted

## 2014-09-18 ENCOUNTER — Inpatient Hospital Stay (HOSPITAL_COMMUNITY)
Admission: AD | Admit: 2014-09-18 | Discharge: 2014-09-18 | Disposition: A | Discharge status: Home / Self Care | Payer: Self-pay | Source: Ambulatory Visit | Attending: Obstetrics & Gynecology | Admitting: Obstetrics & Gynecology

## 2014-09-18 DIAGNOSIS — R197 Diarrhea, unspecified: Secondary | ICD-10-CM | POA: Insufficient documentation

## 2014-09-18 DIAGNOSIS — R1032 Left lower quadrant pain: Secondary | ICD-10-CM | POA: Insufficient documentation

## 2014-09-18 DIAGNOSIS — R109 Unspecified abdominal pain: Secondary | ICD-10-CM

## 2014-09-18 DIAGNOSIS — F17200 Nicotine dependence, unspecified, uncomplicated: Secondary | ICD-10-CM | POA: Insufficient documentation

## 2014-09-18 DIAGNOSIS — R634 Abnormal weight loss: Secondary | ICD-10-CM | POA: Insufficient documentation

## 2014-09-18 LAB — CBC
HEMATOCRIT: 41.9 % (ref 36.0–46.0)
Hemoglobin: 14.1 g/dL (ref 12.0–15.0)
MCH: 28.5 pg (ref 26.0–34.0)
MCHC: 33.7 g/dL (ref 30.0–36.0)
MCV: 84.6 fL (ref 78.0–100.0)
Platelets: 207 10*3/uL (ref 150–400)
RBC: 4.95 MIL/uL (ref 3.87–5.11)
RDW: 13.9 % (ref 11.5–15.5)
WBC: 7.1 10*3/uL (ref 4.0–10.5)

## 2014-09-18 LAB — WET PREP, GENITAL
Clue Cells Wet Prep HPF POC: NONE SEEN
Trich, Wet Prep: NONE SEEN
Yeast Wet Prep HPF POC: NONE SEEN

## 2014-09-18 LAB — URINALYSIS, ROUTINE W REFLEX MICROSCOPIC
Bilirubin Urine: NEGATIVE
Glucose, UA: NEGATIVE mg/dL
Hgb urine dipstick: NEGATIVE
Ketones, ur: NEGATIVE mg/dL
LEUKOCYTES UA: NEGATIVE
NITRITE: NEGATIVE
PROTEIN: NEGATIVE mg/dL
SPECIFIC GRAVITY, URINE: 1.02 (ref 1.005–1.030)
UROBILINOGEN UA: 0.2 mg/dL (ref 0.0–1.0)
pH: 6 (ref 5.0–8.0)

## 2014-09-18 LAB — TSH: TSH: 1.458 u[IU]/mL (ref 0.350–4.500)

## 2014-09-18 LAB — POCT PREGNANCY, URINE: PREG TEST UR: NEGATIVE

## 2014-09-18 LAB — T4, FREE: FREE T4: 0.95 ng/dL (ref 0.61–1.12)

## 2014-09-18 MED ORDER — IBUPROFEN 600 MG PO TABS
600.0000 mg | ORAL_TABLET | Freq: Once | ORAL | Status: AC
  Administered 2014-09-18: 600 mg via ORAL
  Filled 2014-09-18: qty 1

## 2014-09-18 NOTE — MAU Provider Note (Signed)
History     CSN: [id number]  Arrival date and time: 09/18/14 1345   First Provider Initiated Contact with Patient 09/18/14 1430      Chief Complaint  Patient presents with  . ovarian pain    HPI   [name] is a 29 y.o. female presenting to MAU with left lower quadrant pain times 2  Months. The pain comes and goes throughout her lower abdomen, however is worse on the left side.  The pain worsens when she is on her menstrual cycle. She has not tried anything for the pain. She had an ovarian cyst in the past and feels the pain is similar.   The patient eats a healthy diet and avoids fried foods. She has BM's every day, however lately her stool has been soft/ runny at times. She has one BM per day.  She drinks minimal water and drinks at least 6 cups of coffee per day.   She has been losing weight over the last month for no reason. She is eating like she normally does. She is worried about her thyroid because it runs in her family; she is unsure whether it is hyper or hypothyroidism that runs in her family.   She does not have PCP because she does not have income or insurance at this time.   No recent antibiotic use.   OB History    Gravida Para Term Preterm AB TAB SAB Ectopic Multiple Living   Past Medical History  Diagnosis Date  . Depression     Past Surgical History  Procedure Laterality Date  . Cholecystectomy    . Hernia repair      History reviewed. No pertinent family history.  Social History  Substance Use Topics  . Smoking status: Current Some Day Smoker  . Smokeless tobacco: None  . Alcohol Use: No     Comment: ocassionally    Allergies: No Known Allergies  Prescriptions prior to admission  Medication Sig Dispense Refill Last Dose  . HYDROcodone-acetaminophen (LORTAB) 7.5-500 MG/15ML solution Take 15 mLs by mouth every 6 (six) hours as needed for pain. 120 mL 0 01/31/2012 at Unknown   Results for orders placed or performed  during the hospital encounter of 09/18/14 (from the past 48 hour(s))  Urinalysis, Routine w reflex microscopic (not at [facility])     Status: None   Collection Time: 09/18/14  2:15 PM  Result Value Ref Range   Color, Urine YELLOW YELLOW   APPearance CLEAR CLEAR   Specific Gravity, Urine 1.020 1.005 - 1.030   pH 6.0 5.0 - 8.0   Glucose, UA NEGATIVE NEGATIVE mg/dL   Hgb urine dipstick NEGATIVE NEGATIVE   Bilirubin Urine NEGATIVE NEGATIVE   Ketones, ur NEGATIVE NEGATIVE mg/dL   Protein, ur NEGATIVE NEGATIVE mg/dL   Urobilinogen, UA 0.2 0.0 - 1.0 mg/dL   Nitrite NEGATIVE NEGATIVE   Leukocytes, UA NEGATIVE NEGATIVE    Comment: MICROSCOPIC NOT DONE ON URINES WITH NEGATIVE PROTEIN, BLOOD, LEUKOCYTES, NITRITE, OR GLUCOSE <1000 mg/dL.  Pregnancy, urine POC     Status: None   Collection Time: 09/18/14  2:37 PM  Result Value Ref Range   Preg Test, Ur NEGATIVE NEGATIVE    Comment:        THE SENSITIVITY OF THIS METHODOLOGY IS >24 mIU/mL   Wet prep, genital     Status: Abnormal   Collection Time: 09/18/14  2:50 PM  Result  Value Ref Range   Yeast Wet Prep HPF POC NONE SEEN NONE SEEN   Trich, Wet Prep NONE SEEN NONE SEEN   Clue Cells Wet Prep HPF POC NONE SEEN NONE SEEN   WBC, Wet Prep HPF POC FEW (A) NONE SEEN    Comment: MANY BACTERIA SEEN  CBC     Status: None   Collection Time: 09/18/14  2:59 PM  Result Value Ref Range   WBC 7.1 4.0 - 10.5 K/uL   RBC 4.95 3.87 - 5.11 MIL/uL   Hemoglobin 14.1 12.0 - 15.0 g/dL   HCT 86.5 78.4 - 69.6 %   MCV 84.6 78.0 - 100.0 fL   MCH 28.5 26.0 - 34.0 pg   MCHC 33.7 30.0 - 36.0 g/dL   RDW 29.5 28.4 - 13.2 %   Platelets 207 150 - 400 K/uL    Review of Systems  Constitutional: Positive for weight loss. Negative for fever and chills.  Gastrointestinal: Positive for diarrhea. Negative for nausea, vomiting and constipation.   Physical Exam   Blood pressure 101/60, pulse 61, temperature 98 F (36.7 C), temperature source Oral, resp. rate 18, height 5\' 1"   (1.549 m), weight 51.71 kg (114 lb), last menstrual period 09/12/2014.  Physical Exam  Constitutional: She is oriented to person, place, and time. She appears well-developed and well-nourished. No distress.  HENT:  Head: Normocephalic.  Eyes: Pupils are equal, round, and reactive to light.  Neck: Trachea normal. Neck supple. No thyroid mass and no thyromegaly present.  GI: Soft. There is tenderness in the suprapubic area and left lower quadrant. There is no rigidity, no rebound and no guarding.  Genitourinary:  Speculum exam: Vagina - Small amount of creamy, brown discharge, no odor Cervix - No contact bleeding Bimanual exam: Cervix closed Uterus non tender, normal size Adnexa non tender, no masses bilaterally GC/Chlam, wet prep done Chaperone present for exam.  Musculoskeletal: Normal range of motion.  Neurological: She is alert and oriented to person, place, and time.  Skin: She is not diaphoretic.  Psychiatric: Her behavior is normal.    MAU Course  Procedures  None  MDM  TSH Free T3 and T4  CBC  Assessment and Plan   A:  1. Left sided abdominal pain   2. Diarrhea in adult patient   3. Weight loss, unintentional     P:  Discharge home in stable condition Information given on PCP's in the area.  Out patient pelvic US ordered; patient given the [facility] contact information and is encouraged to call to schedule an appointment after the [place].  TSH, T3 and T4 pending; I will call the patient on Monday with the results.  Decrease caffeine intake. Drink more water   Duane Lope, NP 09/18/2014 2:40 PM

## 2014-09-18 NOTE — MAU Note (Signed)
Just finished period 3 days ago. Noticed sharp pain on left side where ovary is. This has happened the past 2 months after period.  Has not tried to do anything to make it better because dont like taking pills

## 2014-09-19 LAB — T3, FREE: T3, Free: 2.9 pg/mL (ref 2.0–4.4)

## 2014-09-21 LAB — GC/CHLAMYDIA PROBE AMP (~~LOC~~) NOT AT ARMC
Chlamydia: NEGATIVE
Neisseria Gonorrhea: NEGATIVE

## 2014-09-28 ENCOUNTER — Ambulatory Visit (HOSPITAL_COMMUNITY): Admission: RE | Admit: 2014-09-28 | Payer: Self-pay | Source: Ambulatory Visit

## 2014-11-16 ENCOUNTER — Inpatient Hospital Stay (HOSPITAL_COMMUNITY)
Admission: AD | Admit: 2014-11-16 | Discharge: 2014-11-16 | Disposition: A | Discharge status: Home / Self Care | Payer: Self-pay | Source: Ambulatory Visit | Attending: Obstetrics & Gynecology | Admitting: Obstetrics & Gynecology

## 2014-11-16 ENCOUNTER — Encounter (HOSPITAL_COMMUNITY): Payer: Self-pay

## 2014-11-16 ENCOUNTER — Inpatient Hospital Stay (HOSPITAL_COMMUNITY): Payer: Self-pay

## 2014-11-16 DIAGNOSIS — N946 Dysmenorrhea, unspecified: Secondary | ICD-10-CM | POA: Insufficient documentation

## 2014-11-16 DIAGNOSIS — K580 Irritable bowel syndrome with diarrhea: Secondary | ICD-10-CM | POA: Insufficient documentation

## 2014-11-16 DIAGNOSIS — F17200 Nicotine dependence, unspecified, uncomplicated: Secondary | ICD-10-CM | POA: Insufficient documentation

## 2014-11-16 DIAGNOSIS — R109 Unspecified abdominal pain: Secondary | ICD-10-CM | POA: Insufficient documentation

## 2014-11-16 DIAGNOSIS — R102 Pelvic and perineal pain: Secondary | ICD-10-CM | POA: Insufficient documentation

## 2014-11-16 LAB — URINALYSIS, ROUTINE W REFLEX MICROSCOPIC
Bilirubin Urine: NEGATIVE
Glucose, UA: NEGATIVE mg/dL
Hgb urine dipstick: NEGATIVE
Ketones, ur: NEGATIVE mg/dL
LEUKOCYTES UA: NEGATIVE
NITRITE: NEGATIVE
PROTEIN: NEGATIVE mg/dL
Specific Gravity, Urine: 1.025 (ref 1.005–1.030)
UROBILINOGEN UA: 0.2 mg/dL (ref 0.0–1.0)
pH: 6.5 (ref 5.0–8.0)

## 2014-11-16 LAB — POCT PREGNANCY, URINE: PREG TEST UR: NEGATIVE

## 2014-11-16 MED ORDER — ONDANSETRON 8 MG PO TBDP
8.0000 mg | ORAL_TABLET | Freq: Once | ORAL | Status: AC
  Administered 2014-11-16: 8 mg via ORAL
  Filled 2014-11-16: qty 1

## 2014-11-16 MED ORDER — ONDANSETRON HCL 4 MG/2ML IJ SOLN: 4.0000 mg | Freq: Once | INTRAMUSCULAR | Status: DC

## 2014-11-16 MED ORDER — DICYCLOMINE HCL 10 MG PO CAPS
20.0000 mg | ORAL_CAPSULE | Freq: Once | ORAL | Status: AC
  Administered 2014-11-16: 20 mg via ORAL
  Filled 2014-11-16: qty 2

## 2014-11-16 MED ORDER — KETOROLAC TROMETHAMINE 60 MG/2ML IM SOLN
60.0000 mg | Freq: Once | INTRAMUSCULAR | Status: AC
  Administered 2014-11-16: 60 mg via INTRAMUSCULAR
  Filled 2014-11-16: qty 2

## 2014-11-16 MED ORDER — DICYCLOMINE HCL 20 MG PO TABS: 20.0000 mg | ORAL_TABLET | Freq: Two times a day (BID) | ORAL | Status: AC

## 2014-11-16 MED ORDER — NORGESTIMATE-ETH ESTRADIOL 0.25-35 MG-MCG PO TABS: 1.0000 | ORAL_TABLET | Freq: Every day | ORAL | Status: AC

## 2014-11-16 NOTE — MAU Note (Addendum)
Having ovary pain , bilateral. Cramping, started before onset of cycle.  Cycle was brief, pain continues.  Affecting her work.  Started having diarrhea today, cramping proceeds episodes, has had 6 stools today

## 2014-11-16 NOTE — MAU Provider Note (Signed)
History     CSN: [id number]  Arrival date and time: 11/16/14 1609   None     Chief Complaint  Patient presents with  . Abdominal Pain   HPI Pt is not pregnant , initially seen on 09/18/2014 for pelvic pain and was to have outpatient ultrasound but pt not able to make the appointment and has not been able to reschedule her appointment.  Pt has had pelvic pain, more on the right side.  Pt thinks she has an ovarian cyst.  Pt states her pain is increasingly more painful. Today she has had bouts of diarrhea this morning.  Pt has been drinking coffee today, not eating anything, no appetite. Pt took Pamprin yesterday with some relief in her pain. Pt denies chills or fever When pt was here on 09/18/2014, she had been losing weight and worried about her thyroid- TSH, T3 and T4 were drawn and normal.  Pt also had diarrhea when she was here before.  Pt's partner states pt's eating habits are not good and that pt has frequent bouts of diarrhea Pain onset 3 months ago.  Pt's LMP was 3 days ago and was heavy with clots.  Pt has painful periods.   Patient is in female/female relationship.  Pt had neg wet prep and neg GC/chlamydia on 09/18/3014. Pt was to contact [facility] for appointment RN note: Spurlock-Frizzell, RN (Registered Nurse)     Expand All Collapse All   Having ovary pain , bilateral. Cramping, started before onset of cycle. Cycle was brief, pain continues. Affecting her work.       Editor: Micki Riley, RN (Registered Nurse)     Expand All Collapse All   Pt c/o ovarian pain. Pt thinks she has a cyst on her ovary. Pt reports the pain starting three months ago but getting increasingly more painful. Pt reports her period started three days ago and she had clots and heavy bleeding but it stopped today        Past Medical History  Diagnosis Date  . Depression     Past Surgical History  Procedure Laterality Date  . Cholecystectomy    . Hernia repair      Family History   Problem Relation Age of Onset  . Asthma Brother   . Asthma Son     Social History  Substance Use Topics  . Smoking status: Current Some Day Smoker  . Smokeless tobacco: None  . Alcohol Use: No     Comment: ocassionally    Allergies: No Known Allergies  Prescriptions prior to admission  Medication Sig Dispense Refill Last Dose  . APAP-Pamabrom-Pyrilamine (PAMPRIN MULTI-SYMPTOM) 500-25-15 MG TABS Take 1 tablet by mouth daily as needed (For cramping.).   Past Week at Unknown time  . vitamin E 100 UNIT capsule Take 100 Units by mouth daily.   Past Week at Unknown time    Review of Systems  Constitutional: Negative for fever and chills.  Gastrointestinal: Positive for nausea, abdominal pain and diarrhea. Negative for vomiting and constipation.  Genitourinary: Negative for dysuria.  Neurological: Negative for headaches.   Physical Exam   Blood pressure 112/63, pulse 68, temperature 97.9 F (36.6 C), temperature source Oral, resp. rate 18, height  (1.549 m), weight 121 lb (54.885 kg), last menstrual period 11/14/2014.  Physical Exam  Nursing note and vitals reviewed. Constitutional: She is oriented to person, place, and time. She appears well-developed and well-nourished. No distress.  HENT:  Head: Normocephalic.  Eyes: Pupils are  equal, round, and reactive to light.  Neck: Normal range of motion. Neck supple.  Cardiovascular: Normal rate.   Respiratory: Effort normal.  GI: Soft. She exhibits no distension. There is tenderness. There is guarding. There is no rebound.  RLQ tenderness with palpation  Genitourinary:  Clean, cervix clean, NT; uterus NSSC NT; right adnexa tender without rebound- no appreciable enlargement bilaterally  Musculoskeletal: Normal range of motion.  Neurological: She is alert and oriented to person, place, and time.  Skin: Skin is warm and dry.  Psychiatric: She has a normal mood and affect.    MAU Course  Procedures Results for orders placed  or performed during the hospital encounter of 11/16/14 (from the past 24 hour(s))  Urinalysis, Routine w reflex microscopic (not at [facility])     Status: None   Collection Time: 11/16/14  4:30 PM  Result Value Ref Range   Color, Urine YELLOW YELLOW   APPearance CLEAR CLEAR   Specific Gravity, Urine 1.025 1.005 - 1.030   pH 6.5 5.0 - 8.0   Glucose, UA NEGATIVE NEGATIVE mg/dL   Hgb urine dipstick NEGATIVE NEGATIVE   Bilirubin Urine NEGATIVE NEGATIVE   Ketones, ur NEGATIVE NEGATIVE mg/dL   Protein, ur NEGATIVE NEGATIVE mg/dL   Urobilinogen, UA 0.2 0.0 - 1.0 mg/dL   Nitrite NEGATIVE NEGATIVE   Leukocytes, UA NEGATIVE NEGATIVE  Pregnancy, urine POC     Status: None   Collection Time: 11/16/14  5:07 PM  Result Value Ref Range   Preg Test, Ur NEGATIVE NEGATIVE  Zofran 8mg  ODT  toradol 60mg  IM Bentyl PO Wet prep and GC/Chlamydia neg on 09/18/2014 US performed b/c pt unable to make previous appointment and pain has worsened [place] Transvaginal Non-ob  11/16/2014  CLINICAL DATA:  Sharp pelvic pains bilaterally. Pain for 3 months but increasing closer to periods. EXAM: TRANSABDOMINAL AND TRANSVAGINAL ULTRASOUND OF PELVIS TECHNIQUE: Both transabdominal and transvaginal ultrasound examinations of the pelvis were performed. Transabdominal technique was performed for global imaging of the pelvis including uterus, ovaries, adnexal regions, and pelvic cul-de-sac. It was necessary to proceed with endovaginal exam following the transabdominal exam to visualize the ovaries, endometrium, and cervix. COMPARISON:  CT abdomen and pelvis 09/29/2011 FINDINGS: Uterus Measurements: 9.3 x 3.1 x 5.3 cm. The uterus is anteverted. No fibroids or other mass visualized. Cervix is unremarkable. Endometrium Thickness: 3.3 mm. Tiny sliver of fluid in the endometrium. No evidence to suggest mass or polyp. Right ovary Measurements: 3 x 1.5 x 3.4 cm. Normal appearance/no adnexal mass. Left ovary Measurements: 2.9 x 1.3 x 2.6 cm. Normal  appearance/no adnexal mass. Other findings Minimal free fluid in the pelvis. IMPRESSION: Normal endometrial stripe thickness with tiny sliver of fluid in the endometrium. This is of nonspecific etiology. Uterus and ovaries are otherwise normal. Electronically Signed   By: Burman NievesWilliam  Stevens M.D.   On: 11/16/2014 18:57   [place] Pelvis Complete  11/16/2014  CLINICAL DATA:  Sharp pelvic pains bilaterally. Pain for 3 months but increasing closer to periods. EXAM: TRANSABDOMINAL AND TRANSVAGINAL ULTRASOUND OF PELVIS TECHNIQUE: Both transabdominal and transvaginal ultrasound examinations of the pelvis were performed. Transabdominal technique was performed for global imaging of the pelvis including uterus, ovaries, adnexal regions, and pelvic cul-de-sac. It was necessary to proceed with endovaginal exam following the transabdominal exam to visualize the ovaries, endometrium, and cervix. COMPARISON:  CT abdomen and pelvis 09/29/2011 FINDINGS: Uterus Measurements: 9.3 x 3.1 x 5.3 cm. The uterus is anteverted. No fibroids or other mass visualized. Cervix is unremarkable. Endometrium  Thickness: 3.3 mm. Tiny sliver of fluid in the endometrium. No evidence to suggest mass or polyp. Right ovary Measurements: 3 x 1.5 x 3.4 cm. Normal appearance/no adnexal mass. Left ovary Measurements: 2.9 x 1.3 x 2.6 cm. Normal appearance/no adnexal mass. Other findings Minimal free fluid in the pelvis. IMPRESSION: Normal endometrial stripe thickness with tiny sliver of fluid in the endometrium. This is of nonspecific etiology. Uterus and ovaries are otherwise normal. Electronically Signed   By: Burman Nieves M.D.   On: 11/16/2014 18:57   Assessment and Plan  Abdominal pain in female Irritable bowel syndrome with diarrhea- Rx for Bentyl given along with diet recommendations-BRAT diet until diarrhea controlled Recommend additional Vitamin D Dysmenorrhea- sprintec Rx- f/u in [facility] if sx worsen [place] [facility]  recommended  [facility] 11/16/2014, 5:01 PM

## 2014-11-16 NOTE — MAU Note (Signed)
Pt c/o ovarian pain. Pt thinks she has a cyst on her ovary. Pt reports the pain starting three months ago but getting increasingly more painful. Pt reports her period started three days ago and she had clots and heavy bleeding but it stopped today.

## 2016-12-01 ENCOUNTER — Encounter (HOSPITAL_COMMUNITY): Payer: Self-pay

## 2016-12-01 ENCOUNTER — Inpatient Hospital Stay (HOSPITAL_COMMUNITY)
Admission: AD | Admit: 2016-12-01 | Discharge: 2016-12-01 | Disposition: A | Discharge status: Home / Self Care | Payer: Self-pay | Source: Ambulatory Visit | Attending: Obstetrics and Gynecology | Admitting: Obstetrics and Gynecology

## 2016-12-01 ENCOUNTER — Other Ambulatory Visit: Payer: Self-pay

## 2016-12-01 DIAGNOSIS — R21 Rash and other nonspecific skin eruption: Secondary | ICD-10-CM | POA: Insufficient documentation

## 2016-12-01 DIAGNOSIS — R109 Unspecified abdominal pain: Secondary | ICD-10-CM | POA: Insufficient documentation

## 2016-12-01 DIAGNOSIS — Z9049 Acquired absence of other specified parts of digestive tract: Secondary | ICD-10-CM | POA: Insufficient documentation

## 2016-12-01 DIAGNOSIS — T7421XS Adult sexual abuse, confirmed, sequela: Secondary | ICD-10-CM

## 2016-12-01 DIAGNOSIS — F419 Anxiety disorder, unspecified: Secondary | ICD-10-CM

## 2016-12-01 DIAGNOSIS — L239 Allergic contact dermatitis, unspecified cause: Secondary | ICD-10-CM

## 2016-12-01 DIAGNOSIS — Z9889 Other specified postprocedural states: Secondary | ICD-10-CM | POA: Insufficient documentation

## 2016-12-01 DIAGNOSIS — Z79899 Other long term (current) drug therapy: Secondary | ICD-10-CM | POA: Insufficient documentation

## 2016-12-01 DIAGNOSIS — R3915 Urgency of urination: Secondary | ICD-10-CM | POA: Insufficient documentation

## 2016-12-01 DIAGNOSIS — F17200 Nicotine dependence, unspecified, uncomplicated: Secondary | ICD-10-CM | POA: Insufficient documentation

## 2016-12-01 DIAGNOSIS — M940 Chondrocostal junction syndrome [Tietze]: Secondary | ICD-10-CM

## 2016-12-01 DIAGNOSIS — R1030 Lower abdominal pain, unspecified: Secondary | ICD-10-CM

## 2016-12-01 LAB — URINALYSIS, ROUTINE W REFLEX MICROSCOPIC
Bilirubin Urine: NEGATIVE
GLUCOSE, UA: NEGATIVE mg/dL
Hgb urine dipstick: NEGATIVE
KETONES UR: NEGATIVE mg/dL
LEUKOCYTES UA: NEGATIVE
Nitrite: NEGATIVE
PROTEIN: NEGATIVE mg/dL
Specific Gravity, Urine: 1.011 (ref 1.005–1.030)
pH: 7 (ref 5.0–8.0)

## 2016-12-01 LAB — CBC
HCT: 42.9 % (ref 36.0–46.0)
HEMOGLOBIN: 14.2 g/dL (ref 12.0–15.0)
MCH: 29.1 pg (ref 26.0–34.0)
MCHC: 33.1 g/dL (ref 30.0–36.0)
MCV: 87.9 fL (ref 78.0–100.0)
Platelets: 277 10*3/uL (ref 150–400)
RBC: 4.88 MIL/uL (ref 3.87–5.11)
RDW: 13.4 % (ref 11.5–15.5)
WBC: 11.4 10*3/uL — ABNORMAL HIGH (ref 4.0–10.5)

## 2016-12-01 LAB — POCT PREGNANCY, URINE: Preg Test, Ur: NEGATIVE

## 2016-12-01 LAB — WET PREP, GENITAL
Sperm: NONE SEEN
Trich, Wet Prep: NONE SEEN
Yeast Wet Prep HPF POC: NONE SEEN

## 2016-12-01 MED ORDER — IBUPROFEN 600 MG PO TABS: 600.0000 mg | ORAL_TABLET | Freq: Four times a day (QID) | ORAL | 1 refills | Status: AC | PRN
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
## 2016-12-01 NOTE — MAU Note (Signed)
Been having really bad ovary pains, especially when pees.  Going more frequently.  Pain became worse after cycle, getting worse. Has a rash on her face, around mouth, been there about a month; has tried OTC meds, aloe- nothing is working. Pain below her ribcage- worse when she breaths or when she lifts things.  Started about 6 months ago, off and on. Looked like she had varicose veins under her ribs, they are vanishing; but the pain continues.

## 2016-12-01 NOTE — Discharge Instructions (Signed)
In late 2019, the [facility] will be moving to the [facility]. At that time, the MAU (Maternity Admissions Unit), where you are being seen today, will no longer see non-pregnant patients. We strongly encourage you to find a doctor's office before that time, so that you can be seen with any GYN concerns, like vaginal discharge, urinary tract infection, etc.. in a timely manner.   In order to make the office visit more convenient, the Center for [facility] will be offering evening hours from 4pm-7:30pm on Monday. There will be same-day appointments, walk-in appointments and scheduled appointments available during this time. We will be adding more evening hours over the next year before the move.   Center for [facility] [phone]- [phone]  For urgent needs, [facility] is also available for management of urgent GYN complaints such as vaginal discharge or urinary tract infections.     Follow up in the clinic if your lower abdominal symptoms worsen.    [facility]  [address], [place], [place] [phone]  [facility] [address], [place], [place] [phone] or [phone] [email or web address]  [facility] Mon-Fri, 8:30-5:00 [address], [place], [place] [phone] [email or web address]  *Bring your own interpreter at 1st visit  [facility] [address], [place], [place] [phone] [email or web address]   Psychotherapeutic Services/[facility]  [address], [place], [place] [phone]  [facility]-in Mon-Fri, 8am-3pm [address], [place], [place] [phone] [email or web address].[email or web address]  Therapy/Counseling  [facility] [address], [place], [place] [phone]  [facility] [address], [place], [place] [phone]  [facility] [address], [place], [place] [phone] *pacientes que hablen espanol, favor comunicarse con el Sr. [place], extension 2244 o la [address], extension [place]3331 para hacer una cita.   [facility] [address] [place] [phone] (Habla Espanol)  [facility] [address], [place], [place] [phone]  [facility] [address], [address], [place], [place] [phone]   [facility]Kellin Foundation: [facility] Empowering All Survivors)  [address], [place], [place] [phone] [email or web address]  *Uninsured and underinsured, ages 19-64  [facility] [address], [place], [place] [phone] (Habla Espanol)  [facility] [address], [place], [place] [phone] (Habla Espanol)  [facility] [address], [place], [place] [phone] Clarice Pole(Habla Espanol)  [facility] Mon-Thurs 8:30am-8:00pm/ Fri 8:30-7:00pm [address], [place], [place] (3rd floor, located at corner of [address]) Call [phone] to schedule an appointment [email or web address]  [facility] [address], [place], [place]  [phone]  [facility]  [address], [place], [place] [phone]  Social Support MHAG ([facility] [place]) [phone] or www.[email or web address] [address], [address], [place], [place] [phone] * Recovery support and educational programs, including recovery skills classes, support groups, and one-on-one sessions with [place] Certified Peer Support Specialists.    NAMI [facility](National Alliance of the Mentally Ill) [place] NAMI Helpline: [phone] * Family and Friends Support Group/  Contact Philomena DohenyJack Glenn at [phone] for more  information * Family to Beazer HomesFamily Education Program and Basics Class : enroll online or email Darreld McleanMary Pennington at [email or web address]  * Monthly educational meetings, contact Dwain SarnaMitch McGee at [phone] [email or web address]    24- Hour Availability:  Tressie Ellis*[facility] [phone] or [phone]  *  [facility] Crisis (Domestic Violence, Rape, Victim Assistance) Line [phone]  Vesta Mixer* [facility] [phone] or [phone]  * [facility]  [phone](8am-4pm [phone]) [phone](after hours)  *[facility] Mobile Crisis Unit [phone]  *[place] National Suicide Hotline [phone]

## 2016-12-02 LAB — RPR: RPR Ser Ql: NONREACTIVE

## 2016-12-02 LAB — HIV ANTIBODY (ROUTINE TESTING W REFLEX): HIV SCREEN 4TH GENERATION: NONREACTIVE

## 2016-12-03 LAB — GC/CHLAMYDIA PROBE AMP (~~LOC~~) NOT AT ARMC
Chlamydia: NEGATIVE
Neisseria Gonorrhea: NEGATIVE
